# Patient Record
Sex: Female | Born: 1985 | Hispanic: Yes | Marital: Married | Smoking: Never smoker
Health system: Southern US, Community
[De-identification: ages and names within clinical notes are randomized; demographics above are authoritative.]

## PROBLEM LIST (undated history)

## (undated) DIAGNOSIS — O24419 Gestational diabetes mellitus in pregnancy, unspecified control: Secondary | ICD-10-CM

## (undated) DIAGNOSIS — J45909 Unspecified asthma, uncomplicated: Secondary | ICD-10-CM

## (undated) HISTORY — PX: SHOULDER SURGERY: SHX246

## (undated) HISTORY — DX: Gestational diabetes mellitus in pregnancy, unspecified control: O24.419

---

## 2016-12-15 ENCOUNTER — Encounter: Payer: Self-pay | Admitting: *Deleted

## 2016-12-15 ENCOUNTER — Ambulatory Visit
Admission: EM | Admit: 2016-12-15 | Discharge: 2016-12-15 | Disposition: A | Discharge status: Home / Self Care | Payer: BLUE CROSS/BLUE SHIELD | Attending: Family Medicine | Admitting: Family Medicine

## 2016-12-15 DIAGNOSIS — J4521 Mild intermittent asthma with (acute) exacerbation: Secondary | ICD-10-CM | POA: Diagnosis not present

## 2016-12-15 HISTORY — DX: Unspecified asthma, uncomplicated: J45.909

## 2016-12-15 MED ORDER — ALBUTEROL SULFATE HFA 108 (90 BASE) MCG/ACT IN AERS: INHALATION_SPRAY | RESPIRATORY_TRACT | 0 refills | Status: AC

## 2016-12-15 MED ORDER — PREDNISONE 20 MG PO TABS: ORAL_TABLET | ORAL | 0 refills | Status: DC

## 2016-12-15 MED ORDER — AZITHROMYCIN 250 MG PO TABS: ORAL_TABLET | ORAL | 0 refills | Status: DC

## 2016-12-15 NOTE — ED Provider Notes (Signed)
[facility]    CSN: [id number] Arrival date & time: 12/15/16  1826     History   Chief Complaint Chief Complaint  Patient presents with  . Shortness of Breath  . Cough  . Fever    HPI Renee Gonzalez is a 31 y.o. female.   The history is provided by the patient.  Shortness of Breath  Associated symptoms: cough, fever and wheezing   Cough  Associated symptoms: fever, shortness of breath and wheezing   Fever  Associated symptoms: congestion and cough   URI  Presenting symptoms: congestion, cough and fever   Severity:  Moderate Onset quality:  Sudden Duration:  5 days Timing:  Constant Progression:  Worsening Chronicity:  New Relieved by:  Nothing Ineffective treatments:  OTC medications Associated symptoms: wheezing   Risk factors: chronic respiratory disease (asthma) and sick contacts   Risk factors: not elderly, no chronic cardiac disease, no chronic kidney disease, no diabetes mellitus, no immunosuppression, no recent illness and no recent travel     Past Medical History:  Diagnosis Date  . Asthma     There are no active problems to display for this patient.   Past Surgical History:  Procedure Laterality Date  . SHOULDER SURGERY Right     OB History    No data available       Home Medications    Prior to Admission medications   Medication Sig Start Date End Date Taking? Authorizing Provider  albuterol (PROVENTIL HFA;VENTOLIN HFA) 108 (90 Base) MCG/ACT inhaler 2 puffs every 4-6 hours prn wheezing, cough 12/15/16   Payton Mccallum, MD  azithromycin (ZITHROMAX Z-PAK) 250 MG tablet 2 tabs po once day 1, then 1 tab po qd for next 4 days 12/15/16   Payton Mccallum, MD  predniSONE (DELTASONE) 20 MG tablet 3 tabs po qd for 2 days, then 2 tabs po qd for 3 days, then 1 tab po qd for 3 days, then half a tab po qd for 2 days 12/15/16   Payton Mccallum, MD    Family History History reviewed. No pertinent family history.  Social History Social History    Substance Use Topics  . Smoking status: Never Smoker  . Smokeless tobacco: Never Used  . Alcohol use Yes     Allergies   Patient has no known allergies.   Review of Systems Review of Systems  Constitutional: Positive for fever.  HENT: Positive for congestion.   Respiratory: Positive for cough, shortness of breath and wheezing.      Physical Exam Triage Vital Signs ED Triage Vitals  Enc Vitals Group     BP 12/15/16 1846 123/81     Pulse Rate 12/15/16 1846 91     Resp 12/15/16 1846 20     Temp 12/15/16 1846 99.1 F (37.3 C)     Temp Source 12/15/16 1846 Oral     SpO2 12/15/16 1846 100 %     Weight 12/15/16 1847 163 lb (73.9 kg)     Height 12/15/16 1847 5' (1.524 m)     Head Circumference --      Peak Flow --      Pain Score --      Pain Loc --      Pain Edu? --      Excl. in GC? --    No data found.   Updated Vital Signs BP 123/81 (BP Location: Left Arm)   Pulse 91   Temp 99.1 F (37.3 C) (Oral)  Resp 20   Ht 5' (1.524 m)   Wt 163 lb (73.9 kg)   SpO2 100%   BMI 31.83 kg/m   Visual Acuity Right Eye Distance:   Left Eye Distance:   Bilateral Distance:    Right Eye Near:   Left Eye Near:    Bilateral Near:     Physical Exam  Constitutional: She appears well-developed and well-nourished. No distress.  HENT:  Head: Normocephalic and atraumatic.  Right Ear: Tympanic membrane, external ear and ear canal normal.  Left Ear: Tympanic membrane, external ear and ear canal normal.  Nose: Mucosal edema and rhinorrhea present. No nose lacerations, sinus tenderness, nasal deformity, septal deviation or nasal septal hematoma. No epistaxis.  No foreign bodies. Right sinus exhibits no maxillary sinus tenderness and no frontal sinus tenderness. Left sinus exhibits no maxillary sinus tenderness and no frontal sinus tenderness.  Mouth/Throat: Uvula is midline, oropharynx is clear and moist and mucous membranes are normal. No oropharyngeal exudate.  Eyes: Conjunctivae  and EOM are normal. Pupils are equal, round, and reactive to light. Right eye exhibits no discharge. Left eye exhibits no discharge. No scleral icterus.  Neck: Normal range of motion. Neck supple. No thyromegaly present.  Cardiovascular: Normal rate, regular rhythm and normal heart sounds.   Pulmonary/Chest: Effort normal. No respiratory distress. She has wheezes. She has rales (left base).  Lymphadenopathy:    She has no cervical adenopathy.  Skin: She is not diaphoretic.  Nursing note and vitals reviewed.    UC Treatments / Results  Labs (all labs ordered are listed, but only abnormal results are displayed) Labs Reviewed - No data to display  EKG  EKG Interpretation None       Radiology No results found.  Procedures Procedures (including critical care time)  Medications Ordered in UC Medications - No data to display   Initial Impression / Assessment and Plan / UC Course  I have reviewed the triage vital signs and the nursing notes.  Pertinent labs & imaging results that were available during my care of the patient were reviewed by me and considered in my medical decision making (see chart for details).  Clinical Course       Final Clinical Impressions(s) / UC Diagnoses   Final diagnoses:  Exacerbation of intermittent asthma, unspecified asthma severity    New Prescriptions New Prescriptions   ALBUTEROL (PROVENTIL HFA;VENTOLIN HFA) 108 (90 BASE) MCG/ACT INHALER    2 puffs every 4-6 hours prn wheezing, cough   AZITHROMYCIN (ZITHROMAX Z-PAK) 250 MG TABLET    2 tabs po once day 1, then 1 tab po qd for next 4 days   PREDNISONE (DELTASONE) 20 MG TABLET    3 tabs po qd for 2 days, then 2 tabs po qd for 3 days, then 1 tab po qd for 3 days, then half a tab po qd for 2 days   1. diagnosis reviewed with patient 2. rx as per orders above; reviewed possible side effects, interactions, risks and benefits  3. Recommend supportive treatment with rest, fluids 4. Follow-up  prn if symptoms worsen or don't improve   Payton Mccallumrlando Chanson Teems, MD 12/15/16 1925

## 2016-12-15 NOTE — ED Triage Notes (Signed)
Non- productive cough, dyspnea, fever, head and chest congestion, since Friday.

## 2017-03-19 ENCOUNTER — Emergency Department
Admission: EM | Admit: 2017-03-19 | Discharge: 2017-03-19 | Disposition: A | Discharge status: Home / Self Care | Payer: BLUE CROSS/BLUE SHIELD | Attending: Emergency Medicine | Admitting: Emergency Medicine

## 2017-03-19 ENCOUNTER — Emergency Department: Payer: BLUE CROSS/BLUE SHIELD

## 2017-03-19 DIAGNOSIS — J45901 Unspecified asthma with (acute) exacerbation: Secondary | ICD-10-CM | POA: Diagnosis not present

## 2017-03-19 DIAGNOSIS — Z79899 Other long term (current) drug therapy: Secondary | ICD-10-CM | POA: Diagnosis not present

## 2017-03-19 DIAGNOSIS — R0602 Shortness of breath: Secondary | ICD-10-CM | POA: Diagnosis present

## 2017-03-19 LAB — COMPREHENSIVE METABOLIC PANEL
ALT: 34 U/L (ref 14–54)
ANION GAP: 9 (ref 5–15)
AST: 36 U/L (ref 15–41)
Albumin: 4.3 g/dL (ref 3.5–5.0)
Alkaline Phosphatase: 71 U/L (ref 38–126)
BUN: 11 mg/dL (ref 6–20)
CHLORIDE: 107 mmol/L (ref 101–111)
CO2: 22 mmol/L (ref 22–32)
Calcium: 9.6 mg/dL (ref 8.9–10.3)
Creatinine, Ser: 0.68 mg/dL (ref 0.44–1.00)
GFR calc non Af Amer: >60 mL/min (ref >60)
GLUCOSE: 139 mg/dL — AB (ref 65–99)
POTASSIUM: 3.8 mmol/L (ref 3.5–5.1)
Sodium: 138 mmol/L (ref 135–145)
Total Bilirubin: 1.2 mg/dL (ref 0.3–1.2)
Total Protein: 7.7 g/dL (ref 6.5–8.1)

## 2017-03-19 LAB — CBC WITH DIFFERENTIAL/PLATELET
BASOS ABS: 0.1 10*3/uL (ref 0–0.1)
Basophils Relative: 1 %
Eosinophils Absolute: 0.7 10*3/uL (ref 0–0.7)
Eosinophils Relative: 7 %
HEMATOCRIT: 41.4 % (ref 35.0–47.0)
Hemoglobin: 14.3 g/dL (ref 12.0–16.0)
LYMPHS ABS: 2.7 10*3/uL (ref 1.0–3.6)
LYMPHS PCT: 26 %
MCH: 31.6 pg (ref 26.0–34.0)
MCHC: 34.7 g/dL (ref 32.0–36.0)
MCV: 91.3 fL (ref 80.0–100.0)
MONO ABS: 0.5 10*3/uL (ref 0.2–0.9)
Monocytes Relative: 5 %
NEUTROS ABS: 6.4 10*3/uL (ref 1.4–6.5)
Neutrophils Relative %: 61 %
PLATELETS: 393 10*3/uL (ref 150–440)
RBC: 4.54 MIL/uL (ref 3.80–5.20)
RDW: 13.2 % (ref 11.5–14.5)
WBC: 10.3 10*3/uL (ref 3.6–11.0)

## 2017-03-19 MED ORDER — PREDNISONE 20 MG PO TABS
60.0000 mg | ORAL_TABLET | Freq: Once | ORAL | Status: AC
  Administered 2017-03-19: 60 mg via ORAL
  Filled 2017-03-19: qty 3

## 2017-03-19 MED ORDER — ACETAMINOPHEN 325 MG PO TABS
ORAL_TABLET | ORAL | Status: AC
  Filled 2017-03-19: qty 2

## 2017-03-19 MED ORDER — IPRATROPIUM-ALBUTEROL 0.5-2.5 (3) MG/3ML IN SOLN
RESPIRATORY_TRACT | Status: AC
  Filled 2017-03-19: qty 6

## 2017-03-19 MED ORDER — IPRATROPIUM-ALBUTEROL 0.5-2.5 (3) MG/3ML IN SOLN
9.0000 mL | Freq: Once | RESPIRATORY_TRACT | Status: AC
  Administered 2017-03-19: 9 mL via RESPIRATORY_TRACT

## 2017-03-19 MED ORDER — PREDNISONE 10 MG (21) PO TBPK: ORAL_TABLET | ORAL | 0 refills | Status: DC

## 2017-03-19 MED ORDER — METHYLPREDNISOLONE SODIUM SUCC 125 MG IJ SOLR
INTRAMUSCULAR | Status: AC
  Filled 2017-03-19: qty 2

## 2017-03-19 MED ORDER — IPRATROPIUM-ALBUTEROL 0.5-2.5 (3) MG/3ML IN SOLN
RESPIRATORY_TRACT | Status: AC
  Administered 2017-03-19: 9 mL via RESPIRATORY_TRACT
  Filled 2017-03-19: qty 3

## 2017-03-19 NOTE — ED Provider Notes (Signed)
[facility] Emergency Department Provider Note       Time seen: ----------------------------------------- 12:21 PM on 03/19/2017 -----------------------------------------     I have reviewed the triage vital signs and the nursing notes.   HISTORY   Chief Complaint Shortness of Breath    HPI Renee Gonzalez is a 31 y.o. female who presents to the ED for asthma exacerbation. Patient states her asthma has been worsened since yesterday. She's had tachypnea, states her inhaler at home was not helping. She arrives in distress with audible wheezing. She denies fevers, chills or other complaints.   Past Medical History:  Diagnosis Date  . Asthma     There are no active problems to display for this patient.   Past Surgical History:  Procedure Laterality Date  . SHOULDER SURGERY Right     Allergies Patient has no known allergies.  Social History Social History  Substance Use Topics  . Smoking status: Never Smoker  . Smokeless tobacco: Never Used  . Alcohol use Yes    Review of Systems Constitutional: Negative for fever. Cardiovascular: Negative for chest pain. Respiratory: Positive for shortness of breath Gastrointestinal: Negative for abdominal pain, vomiting and diarrhea. Genitourinary: Negative for dysuria. Musculoskeletal: Negative for back pain. Skin: Negative for rash. Neurological: Negative for headaches, focal weakness or numbness.  10-point ROS otherwise negative.  ____________________________________________   PHYSICAL EXAM:  VITAL SIGNS: ED Triage Vitals  Enc Vitals Group     BP 03/19/17 1152 (!) 152/120     Pulse Rate 03/19/17 1152 (!) 103     Resp 03/19/17 1152 14     Temp 03/19/17 1152 98.1 F (36.7 C)     Temp Source 03/19/17 1152 Oral     SpO2 03/19/17 1152 96 %     Weight 03/19/17 1153 160 lb (72.6 kg)     Height 03/19/17 1153  (1.575 m)     Head Circumference --      Peak Flow --      Pain Score 03/19/17  1206 0     Pain Loc --      Pain Edu? --      Excl. in GC? --     Constitutional: Alert and oriented. Mild to moderate distress Eyes: Conjunctivae are normal. PERRL. Normal extraocular movements. ENT   Head: Normocephalic and atraumatic.   Nose: No congestion/rhinnorhea.   Mouth/Throat: Mucous membranes are moist.   Neck: No stridor. Cardiovascular: Normal rate, regular rhythm. No murmurs, rubs, or gallops. Respiratory: Severe wheezing bilaterally with tachypnea  Gastrointestinal: Soft and nontender. Normal bowel sounds Musculoskeletal: Nontender with normal range of motion in extremities. No lower extremity tenderness nor edema. Neurologic:  Normal speech and language. No gross focal neurologic deficits are appreciated.  Skin:  Skin is warm, dry and intact. No rash noted. Psychiatric: Mood and affect are normal. Speech and behavior are normal.  ____________________________________________  ED COURSE:  Pertinent labs & imaging results that were available during my care of the patient were reviewed by me and considered in my medical decision making (see chart for details). Patient presents for asthma exacerbation, we will assess with labs and imaging as indicated.   Procedures ____________________________________________   LABS (pertinent positives/negatives)  Labs Reviewed  COMPREHENSIVE METABOLIC PANEL - Abnormal; Notable for the following:       Result Value   Glucose, Bld 139 (*)    All other components within normal limits  CBC WITH DIFFERENTIAL/PLATELET    RADIOLOGY Images were viewed by me  Chest x-ray  IMPRESSION: No active cardiopulmonary disease. ____________________________________________  FINAL ASSESSMENT AND PLAN  Asthma exacerbation  Plan: Patient's labs and imaging were dictated above. Patient had presented for asthma, she received 3 DuoNebs as well as oral steroids.Patient had marked improvement in her asthma exacerbation. He only  minimal wheezing with expiration. She was discharged with steroid taper and encouraged to have outpatient follow-up with her doctor.   Emily Filbert, MD   Note: This note was generated in part or whole with voice recognition software. Voice recognition is usually quite accurate but there are transcription errors that can and very often do occur. I apologize for any typographical errors that were not detected and corrected.     Emily Filbert, MD 03/19/17 1323

## 2017-03-19 NOTE — ED Triage Notes (Signed)
Pt c/o asthma flare since yesterday with tachypnea, states her inhaler and nebulizer is not helping.. Pt is distress on arrival with audible wheezing noted.Marland Kitchen

## 2017-03-19 NOTE — ED Notes (Signed)
Patient transported to X-ray 

## 2018-10-17 ENCOUNTER — Encounter: Payer: Self-pay | Admitting: Emergency Medicine

## 2018-10-17 ENCOUNTER — Other Ambulatory Visit: Payer: Self-pay

## 2018-10-17 ENCOUNTER — Inpatient Hospital Stay
Admission: EM | Admit: 2018-10-17 | Discharge: 2018-10-18 | DRG: 202 | Disposition: A | Discharge status: Home / Self Care | Payer: Self-pay | Attending: Internal Medicine | Admitting: Internal Medicine

## 2018-10-17 ENCOUNTER — Emergency Department: Payer: Self-pay

## 2018-10-17 DIAGNOSIS — R0603 Acute respiratory distress: Secondary | ICD-10-CM

## 2018-10-17 DIAGNOSIS — E669 Obesity, unspecified: Secondary | ICD-10-CM | POA: Diagnosis present

## 2018-10-17 DIAGNOSIS — Z6830 Body mass index (BMI) 30.0-30.9, adult: Secondary | ICD-10-CM

## 2018-10-17 DIAGNOSIS — R402253 Coma scale, best verbal response, oriented, at hospital admission: Secondary | ICD-10-CM | POA: Diagnosis present

## 2018-10-17 DIAGNOSIS — J9621 Acute and chronic respiratory failure with hypoxia: Secondary | ICD-10-CM | POA: Diagnosis present

## 2018-10-17 DIAGNOSIS — J9620 Acute and chronic respiratory failure, unspecified whether with hypoxia or hypercapnia: Secondary | ICD-10-CM

## 2018-10-17 DIAGNOSIS — J189 Pneumonia, unspecified organism: Secondary | ICD-10-CM | POA: Diagnosis present

## 2018-10-17 DIAGNOSIS — Z793 Long term (current) use of hormonal contraceptives: Secondary | ICD-10-CM

## 2018-10-17 DIAGNOSIS — R0902 Hypoxemia: Secondary | ICD-10-CM

## 2018-10-17 DIAGNOSIS — R402363 Coma scale, best motor response, obeys commands, at hospital admission: Secondary | ICD-10-CM | POA: Diagnosis present

## 2018-10-17 DIAGNOSIS — R402143 Coma scale, eyes open, spontaneous, at hospital admission: Secondary | ICD-10-CM | POA: Diagnosis present

## 2018-10-17 DIAGNOSIS — J4551 Severe persistent asthma with (acute) exacerbation: Principal | ICD-10-CM | POA: Diagnosis present

## 2018-10-17 DIAGNOSIS — Z79899 Other long term (current) drug therapy: Secondary | ICD-10-CM

## 2018-10-17 DIAGNOSIS — J9622 Acute and chronic respiratory failure with hypercapnia: Secondary | ICD-10-CM | POA: Diagnosis present

## 2018-10-17 LAB — CBC WITH DIFFERENTIAL/PLATELET
ABS IMMATURE GRANULOCYTES: 0.14 10*3/uL — AB (ref 0.00–0.07)
BASOS ABS: 0.1 10*3/uL (ref 0.0–0.1)
BASOS PCT: 1 %
EOS ABS: 0.1 10*3/uL (ref 0.0–0.5)
Eosinophils Relative: 1 %
HCT: 43.4 % (ref 36.0–46.0)
Hemoglobin: 14.5 g/dL (ref 12.0–15.0)
IMMATURE GRANULOCYTES: 1 %
Lymphocytes Relative: 17 %
Lymphs Abs: 2.8 10*3/uL (ref 0.7–4.0)
MCH: 31.7 pg (ref 26.0–34.0)
MCHC: 33.4 g/dL (ref 30.0–36.0)
MCV: 94.8 fL (ref 80.0–100.0)
Monocytes Absolute: 0.9 10*3/uL (ref 0.1–1.0)
Monocytes Relative: 6 %
NEUTROS ABS: 12.4 10*3/uL — AB (ref 1.7–7.7)
NEUTROS PCT: 74 %
NRBC: 0 % (ref 0.0–0.2)
Platelets: 379 10*3/uL (ref 150–400)
RBC: 4.58 MIL/uL (ref 3.87–5.11)
RDW: 12.8 % (ref 11.5–15.5)
WBC: 16.4 10*3/uL — ABNORMAL HIGH (ref 4.0–10.5)

## 2018-10-17 LAB — GLUCOSE, CAPILLARY: Glucose-Capillary: 165 mg/dL — ABNORMAL HIGH (ref 70–99)

## 2018-10-17 LAB — BLOOD GAS, ARTERIAL
Acid-base deficit: 6.7 mmol/L — ABNORMAL HIGH (ref 0.0–2.0)
BICARBONATE: 17.3 mmol/L — AB (ref 20.0–28.0)
Delivery systems: POSITIVE
Expiratory PAP: 5
FIO2: 0.4
Inspiratory PAP: 10
O2 SAT: 99.6 %
PATIENT TEMPERATURE: 37
pCO2 arterial: 30 mmHg — ABNORMAL LOW (ref 32.0–48.0)
pH, Arterial: 7.37 (ref 7.350–7.450)
pO2, Arterial: 190 mmHg — ABNORMAL HIGH (ref 83.0–108.0)

## 2018-10-17 LAB — TSH: TSH: 3.37 u[IU]/mL (ref 0.350–4.500)

## 2018-10-17 LAB — MRSA PCR SCREENING: MRSA BY PCR: NEGATIVE

## 2018-10-17 LAB — BASIC METABOLIC PANEL
ANION GAP: 14 (ref 5–15)
BUN: 10 mg/dL (ref 6–20)
CO2: 21 mmol/L — ABNORMAL LOW (ref 22–32)
CREATININE: 0.76 mg/dL (ref 0.44–1.00)
Calcium: 10.3 mg/dL (ref 8.9–10.3)
Chloride: 110 mmol/L (ref 98–111)
GFR calc non Af Amer: >60 mL/min (ref >60)
Glucose, Bld: 151 mg/dL — ABNORMAL HIGH (ref 70–99)
POTASSIUM: 4 mmol/L (ref 3.5–5.1)
SODIUM: 145 mmol/L (ref 135–145)

## 2018-10-17 LAB — URINE DRUG SCREEN, QUALITATIVE (ARMC ONLY)
AMPHETAMINES, UR SCREEN: NOT DETECTED
BARBITURATES, UR SCREEN: NOT DETECTED
BENZODIAZEPINE, UR SCRN: NOT DETECTED
Cannabinoid 50 Ng, Ur ~~LOC~~: NOT DETECTED
Cocaine Metabolite,Ur ~~LOC~~: NOT DETECTED
MDMA (Ecstasy)Ur Screen: NOT DETECTED
METHADONE SCREEN, URINE: NOT DETECTED
OPIATE, UR SCREEN: NOT DETECTED
Phencyclidine (PCP) Ur S: NOT DETECTED
TRICYCLIC, UR SCREEN: NOT DETECTED

## 2018-10-17 LAB — TROPONIN I: Troponin I: <0.03 ng/mL (ref <0.03)

## 2018-10-17 LAB — PREGNANCY, URINE: PREG TEST UR: NEGATIVE

## 2018-10-17 LAB — INFLUENZA PANEL BY PCR (TYPE A & B)
Influenza A By PCR: NEGATIVE
Influenza B By PCR: NEGATIVE

## 2018-10-17 MED ORDER — ACETAMINOPHEN 650 MG RE SUPP: 650.0000 mg | Freq: Four times a day (QID) | RECTAL | Status: DC | PRN

## 2018-10-17 MED ORDER — SODIUM CHLORIDE 0.9 % IV SOLN
500.0000 mg | INTRAVENOUS | Status: DC
  Administered 2018-10-17: 500 mg via INTRAVENOUS
  Filled 2018-10-17 (×2): qty 500

## 2018-10-17 MED ORDER — CEFTRIAXONE SODIUM 1 G IJ SOLR: 1.0000 g | INTRAMUSCULAR | Status: DC

## 2018-10-17 MED ORDER — SODIUM CHLORIDE 0.9 % IV SOLN
1.0000 g | INTRAVENOUS | Status: DC
  Administered 2018-10-17 – 2018-10-18 (×2): 1 g via INTRAVENOUS
  Filled 2018-10-17 (×2): qty 1
  Filled 2018-10-17 (×2): qty 10

## 2018-10-17 MED ORDER — LORATADINE 10 MG PO TABS
10.0000 mg | ORAL_TABLET | Freq: Every day | ORAL | Status: DC
  Administered 2018-10-17 – 2018-10-18 (×2): 10 mg via ORAL
  Filled 2018-10-17 (×2): qty 1

## 2018-10-17 MED ORDER — IPRATROPIUM-ALBUTEROL 0.5-2.5 (3) MG/3ML IN SOLN
3.0000 mL | Freq: Once | RESPIRATORY_TRACT | Status: AC
  Administered 2018-10-17: 3 mL via RESPIRATORY_TRACT

## 2018-10-17 MED ORDER — ACETAMINOPHEN 325 MG PO TABS
650.0000 mg | ORAL_TABLET | Freq: Four times a day (QID) | ORAL | Status: DC | PRN
  Administered 2018-10-18: 650 mg via ORAL
  Filled 2018-10-17: qty 2

## 2018-10-17 MED ORDER — METHYLPREDNISOLONE SODIUM SUCC 125 MG IJ SOLR
INTRAMUSCULAR | Status: AC
  Administered 2018-10-17: 125 mg via INTRAVENOUS
  Filled 2018-10-17: qty 2

## 2018-10-17 MED ORDER — BUDESONIDE 0.5 MG/2ML IN SUSP
0.5000 mg | Freq: Two times a day (BID) | RESPIRATORY_TRACT | Status: DC
  Administered 2018-10-17 – 2018-10-18 (×3): 0.5 mg via RESPIRATORY_TRACT
  Filled 2018-10-17 (×3): qty 2

## 2018-10-17 MED ORDER — ONDANSETRON HCL 4 MG/2ML IJ SOLN: 4.0000 mg | Freq: Four times a day (QID) | INTRAMUSCULAR | Status: DC | PRN

## 2018-10-17 MED ORDER — METHYLPREDNISOLONE SODIUM SUCC 125 MG IJ SOLR
125.0000 mg | Freq: Once | INTRAMUSCULAR | Status: AC
  Administered 2018-10-17: 125 mg via INTRAVENOUS

## 2018-10-17 MED ORDER — METHYLPREDNISOLONE SODIUM SUCC 125 MG IJ SOLR: 60.0000 mg | INTRAMUSCULAR | Status: DC

## 2018-10-17 MED ORDER — SODIUM CHLORIDE 0.9 % IV SOLN
INTRAVENOUS | Status: DC | PRN
  Administered 2018-10-17: 250 mL via INTRAVENOUS

## 2018-10-17 MED ORDER — METHYLPREDNISOLONE SODIUM SUCC 40 MG IJ SOLR
40.0000 mg | Freq: Two times a day (BID) | INTRAMUSCULAR | Status: DC
  Administered 2018-10-17 – 2018-10-18 (×2): 40 mg via INTRAVENOUS
  Filled 2018-10-17 (×2): qty 1

## 2018-10-17 MED ORDER — ONDANSETRON HCL 4 MG PO TABS: 4.0000 mg | ORAL_TABLET | Freq: Four times a day (QID) | ORAL | Status: DC | PRN

## 2018-10-17 MED ORDER — ENOXAPARIN SODIUM 40 MG/0.4ML ~~LOC~~ SOLN
40.0000 mg | SUBCUTANEOUS | Status: DC
  Administered 2018-10-17: 40 mg via SUBCUTANEOUS
  Filled 2018-10-17: qty 0.4

## 2018-10-17 MED ORDER — MAGNESIUM SULFATE 2 GM/50ML IV SOLN
2.0000 g | Freq: Once | INTRAVENOUS | Status: AC
  Administered 2018-10-17: 2 g via INTRAVENOUS
  Filled 2018-10-17: qty 50

## 2018-10-17 MED ORDER — IPRATROPIUM-ALBUTEROL 0.5-2.5 (3) MG/3ML IN SOLN
RESPIRATORY_TRACT | Status: AC
  Administered 2018-10-17: 3 mL via RESPIRATORY_TRACT
  Filled 2018-10-17: qty 9

## 2018-10-17 MED ORDER — MONTELUKAST SODIUM 10 MG PO TABS
10.0000 mg | ORAL_TABLET | Freq: Every day | ORAL | Status: DC
  Administered 2018-10-17: 10 mg via ORAL
  Filled 2018-10-17 (×2): qty 1

## 2018-10-17 MED ORDER — DOCUSATE SODIUM 100 MG PO CAPS
100.0000 mg | ORAL_CAPSULE | Freq: Two times a day (BID) | ORAL | Status: DC
  Filled 2018-10-17 (×2): qty 1

## 2018-10-17 MED ORDER — ALBUTEROL SULFATE (2.5 MG/3ML) 0.083% IN NEBU
2.5000 mg | INHALATION_SOLUTION | RESPIRATORY_TRACT | Status: DC
  Administered 2018-10-17 – 2018-10-18 (×7): 2.5 mg via RESPIRATORY_TRACT
  Filled 2018-10-17 (×8): qty 3

## 2018-10-17 NOTE — H&P (Signed)
Renee Gonzalez is an 32 y.o. female.   Chief Complaint: Shortness of breath HPI: The patient with past medical history of asthma presents to the emergency department by private vehicle after awakening at 5 AM unable to breathe.  The patient had returned home approximately midnight with shortness of breath.  She tried using her albuterol inhaler which helped briefly but the patient continued to wheeze and have increased work of breathing.  Upon arrival she was tripod positioned and moving very little air.  She was placed on BiPAP after receiving Solu-Medrol.  Multiple breathing treatments were administered which improved air movement but not work of breathing which prompted the emergency department staff to call the hospitalist service for admission.  Past Medical History:  Diagnosis Date  . Asthma     Past Surgical History:  Procedure Laterality Date  . SHOULDER SURGERY Right     Family History  Problem Relation Age of Onset  . Diabetes Mellitus II Mother    Social History:  reports that she has never smoked. She has never used smokeless tobacco. She reports that she drinks alcohol. Her drug history is not on file.  Allergies: No Known Allergies  Prior to Admission medications   Medication Sig Start Date End Date Taking? Authorizing Provider  albuterol (PROVENTIL HFA;VENTOLIN HFA) 108 (90 Base) MCG/ACT inhaler 2 puffs every 4-6 hours prn wheezing, cough 12/15/16  Yes Conty, Orlando, MD  levonorgestrel-ethinyl estradiol (NORDETTE) 0.15-30 MG-MCG tablet Take 1 tablet by mouth daily. 12/10/17  Yes [provider]  loratadine (CLARITIN) 10 MG tablet Take 10 mg by mouth daily.   Yes [provider]  pseudoephedrine (SUDAFED) 30 MG tablet Take 30 mg by mouth every 4 (four) hours as needed for congestion.   Yes [provider]  predniSONE (STERAPRED UNI-PAK 21 TAB) 10 MG (21) TBPK tablet Dispense steroid taper pack as directed Patient not taking: Reported on 10/17/2018  03/19/17   Earleen Newport, MD     Results for orders placed or performed during the hospital encounter of 10/17/18 (from the past 48 hour(s))  CBC with Differential     Status: Abnormal   Collection Time: 10/17/18  5:59 AM  Result Value Ref Range   WBC 16.4 (H) 4.0 - 10.5 K/uL   RBC 4.58 3.87 - 5.11 MIL/uL   Hemoglobin 14.5 12.0 - 15.0 g/dL   HCT 43.4 36.0 - 46.0 %   MCV 94.8 80.0 - 100.0 fL   MCH 31.7 26.0 - 34.0 pg   MCHC 33.4 30.0 - 36.0 g/dL   RDW 12.8 11.5 - 15.5 %   Platelets 379 150 - 400 K/uL   nRBC 0.0 0.0 - 0.2 %   Neutrophils Relative % 74 %   Neutro Abs 12.4 (H) 1.7 - 7.7 K/uL   Lymphocytes Relative 17 %   Lymphs Abs 2.8 0.7 - 4.0 K/uL   Monocytes Relative 6 %   Monocytes Absolute 0.9 0.1 - 1.0 K/uL   Eosinophils Relative 1 %   Eosinophils Absolute 0.1 0.0 - 0.5 K/uL   Basophils Relative 1 %   Basophils Absolute 0.1 0.0 - 0.1 K/uL   Immature Granulocytes 1 %   Abs Immature Granulocytes 0.14 (H) 0.00 - 0.07 K/uL    Comment: Performed at [facility], [address] [place], [place] [address]  Basic metabolic panel     Status: Abnormal   Collection Time: 10/17/18  5:59 AM  Result Value Ref Range   Sodium 145 135 - 145  mmol/L   Potassium 4.0 3.5 - 5.1 mmol/L   Chloride 110 98 - 111 mmol/L   CO2 21 (L) 22 - 32 mmol/L   Glucose, Bld 151 (H) 70 - 99 mg/dL   BUN 10 6 - 20 mg/dL   Creatinine, Ser 0.76 0.44 - 1.00 mg/dL   Calcium 10.3 8.9 - 10.3 mg/dL   GFR calc non Af Amer >60 >60 mL/min   GFR calc Af Amer >60 >60 mL/min    Comment: (NOTE) The eGFR has been calculated using the CKD EPI equation. This calculation has not been validated in all clinical situations. eGFR's persistently <60 mL/min signify possible Chronic Kidney Disease.    Anion gap 14 5 - 15    Comment: Performed at [facility], [address] [place], [place] [address]  Troponin I - Once     Status: None   Collection Time: 10/17/18  5:59 AM  Result Value Ref Range    Troponin I <0.03 <0.03 ng/mL    Comment: Performed at [facility], [address] [place], [place] [address]  Blood gas, arterial     Status: Abnormal   Collection Time: 10/17/18  6:30 AM  Result Value Ref Range   FIO2 0.40    Delivery systems BILEVEL POSITIVE AIRWAY PRESSURE    Inspiratory PAP 10    Expiratory PAP 5    pH, Arterial 7.37 7.350 - 7.450   pCO2 arterial 30 (L) 32.0 - 48.0 mmHg   pO2, Arterial 190 (H) 83.0 - 108.0 mmHg   Bicarbonate 17.3 (L) 20.0 - 28.0 mmol/L   Acid-base deficit 6.7 (H) 0.0 - 2.0 mmol/L   O2 Saturation 99.6 %   Patient temperature 37.0    Collection site RIGHT RADIAL    Sample type ARTERIAL DRAW    Allens test (pass/fail) PASS PASS    Comment: Performed at [facility], [address] [place], [place] [address]   Dg Chest Port 1 View  Result Date: 10/17/2018 CLINICAL DATA:  Acute onset of respiratory distress. EXAM: PORTABLE CHEST 1 VIEW COMPARISON:  Chest radiograph performed 03/19/2017 FINDINGS: The lungs are well-aerated. Mild peribronchial thickening is noted. Minimal left basilar atelectasis is noted. There is no evidence of pleural effusion or pneumothorax. The cardiomediastinal silhouette is within normal limits. No acute osseous abnormalities are seen. IMPRESSION: Mild peribronchial thickening noted; minimal left basilar atelectasis seen. Electronically Signed   By: Garald Balding M.D.   On: 10/17/2018 05:57    Review of Systems  Constitutional: Negative for chills and fever.  HENT: Negative for sore throat and tinnitus.   Eyes: Negative for blurred vision and redness.  Respiratory: Positive for cough, shortness of breath and wheezing. Negative for sputum production.   Cardiovascular: Negative for chest pain, palpitations, orthopnea and PND.  Gastrointestinal: Negative for abdominal pain, diarrhea, nausea and vomiting.  Genitourinary: Negative for dysuria, frequency and urgency.  Musculoskeletal: Negative for joint pain  and myalgias.  Skin: Negative for rash.       No lesions  Neurological: Negative for speech change, focal weakness and weakness.  Endo/Heme/Allergies: Does not bruise/bleed easily.       No temperature intolerance  Psychiatric/Behavioral: Negative for depression and suicidal ideas.    Blood pressure 134/82, pulse (!) 119, temperature (!) 97.5 F (36.4 C), temperature source Oral, resp. rate 17, height '5\' 3"'$  (1.6 m), weight 77.1 kg, SpO2 100 %. Physical Exam  Vitals reviewed. Constitutional: She is oriented to person, place, and time. She appears  well-developed and well-nourished. She appears distressed.  HENT:  Head: Normocephalic and atraumatic.  Eyes: Pupils are equal, round, and reactive to light. EOM are normal. Right conjunctiva is injected. No scleral icterus.  Neck: Normal range of motion. No tracheal deviation present. No thyromegaly present.  Cardiovascular: Normal rate, regular rhythm and normal heart sounds. Exam reveals no gallop and no friction rub.  No murmur heard. Respiratory: Effort normal. She has wheezes.  GI: Soft. Bowel sounds are normal. She exhibits no distension. There is no tenderness.  Genitourinary:  Genitourinary Comments: Deferred  Musculoskeletal: Normal range of motion. She exhibits no edema.  Lymphadenopathy:    She has no cervical adenopathy.  Neurological: She is alert and oriented to person, place, and time. No cranial nerve deficit. She exhibits normal muscle tone.  Skin: Skin is warm and dry. No rash noted. No erythema.  Psychiatric: She has a normal mood and affect. Her behavior is normal. Judgment and thought content normal.     Assessment/Plan This is a 32 year old female admitted for respiratory failure. 1.  Respiratory failure: Acute; with hypoxia.  Continue BiPAP until work of breathing improves.  Wean supplemental O2 as tolerated.   2.  Asthma: With severe exacerbation.  Continue Solu-Medrol 60 mg daily.  Scheduled breathing treatments  every 4 hours.  Add inhaled corticosteroid.  Manage triggers.  (The patient is apparently allergic to cats and was exposed this evening) 3.  Obesity: BMI is 30.1; encouraged healthy diet and exercise 4.  DVT prophylaxis: Lovenox 5.  GI prophylaxis: None Exline the patient is a full code.  I was personally spent 45 minutes critical care time.  Harrie Foreman, MD 10/17/2018, 7:58 AM

## 2018-10-17 NOTE — ED Notes (Signed)
Dr. Sheryle Hailiamond to bedside at this time to discuss patient concerns regarding BiPap and admission to ICU. Dr. Sheryle Hailiamond discussing importance of BiPap and necessity.

## 2018-10-17 NOTE — Progress Notes (Signed)
Pt stated that she feels much better now with her breathing, good aeration throughout all lung fields. BIPAP removed sats on roomair 95%, respiratory rate 18/min. RN made aware. Will continue to monitor.

## 2018-10-17 NOTE — Progress Notes (Signed)
PT on room air since 1100, O2 sats in 90s, wheezing resolved, breath sounds are clear, ST in low 100s, ABX have been administered for possible PNA, [facility].  No SOB w/ exertion or speech.  Oral intake excellent.  Pt reports she feels "fine." Will transfer to any med-surg when bed available

## 2018-10-17 NOTE — Consult Note (Signed)
Name: Malka Soceneth Simao MRN: [id number] DOB: 10/11/1986     CONSULTATION DATE: 10/17/2018  REFERRING MD : Sheryle Hailiamond  CHIEF COMPLAINT: SOB   HISTORY OF PRESENT ILLNESS:   32 yo female with acute and severe SOB last night Has h/o ASTHMA  Patient placed on biPAP Treated with BD therapy and steroids   I have Independently reviewed images of  CXR  on 10/17/2018 Interpretation:  Left Lower lobe opacity C/w pneumonia  Patient on biPAP Still wheezing  PAST MEDICAL HISTORY :   has a past medical history of Asthma.  has a past surgical history that includes Shoulder surgery (Right). Prior to Admission medications   Medication Sig Start Date End Date Taking? Authorizing Provider  albuterol (PROVENTIL HFA;VENTOLIN HFA) 108 (90 Base) MCG/ACT inhaler 2 puffs every 4-6 hours prn wheezing, cough 12/15/16  Yes Conty, Orlando, MD  levonorgestrel-ethinyl estradiol (NORDETTE) 0.15-30 MG-MCG tablet Take 1 tablet by mouth daily. 12/10/17  Yes [provider]  loratadine (CLARITIN) 10 MG tablet Take 10 mg by mouth daily.   Yes [provider]  pseudoephedrine (SUDAFED) 30 MG tablet Take 30 mg by mouth every 4 (four) hours as needed for congestion.   Yes [provider]  predniSONE (STERAPRED UNI-PAK 21 TAB) 10 MG (21) TBPK tablet Dispense steroid taper pack as directed Patient not taking: Reported on 10/17/2018 03/19/17   Emily FilbertWilliams, Jonathan E, MD   No Known Allergies  FAMILY HISTORY:  family history includes Diabetes Mellitus II in her mother. SOCIAL HISTORY:  reports that she has never smoked. She has never used smokeless tobacco. She reports that she drinks alcohol.  SURG hx-no surgeries per patient Review of Systems:  MVH:QIONGen:resp distress HEENT: Denies blurred vision, double vision, ear pain, eye pain, hearing loss, nose bleeds, sore throat Cardiac:  No dizziness, chest pain or heaviness, chest tightness,edema, No JVD Resp:+SOB +Wheezing Gi: Denies swallowing  difficulty, stomach pain, nausea or vomiting, diarrhea, constipation, bowel incontinence Gu:  Denies bladder incontinence, burning urine Ext:   Denies Joint pain, stiffness or swelling Skin: Denies  skin rash, easy bruising or bleeding or hives Endoc:  Denies polyuria, polydipsia , polyphagia or weight change Psych:   Denies depression, insomnia or hallucinations  Other:  All other systems negative   VITAL SIGNS: Temp:  [97.5 F (36.4 C)] 97.5 F (36.4 C) (11/17 0549) Pulse Rate:  [118-149] 119 (11/17 0745) Resp:  [17-36] 17 (11/17 0745) BP: (128-172)/(72-103) 134/82 (11/17 0745) SpO2:  [77 %-100 %] 100 % (11/17 0745) Weight:  [77.1 kg] 77.1 kg (11/17 [id number])  Physical Examination:  GENERAL:critically ill appearing, +resp distress HEAD: Normocephalic, atraumatic.  EYES: Pupils equal, round, reactive to light.  No scleral icterus.  MOUTH: Moist mucosal membrane. NECK: Supple. No JVD.  PULMONARY: +rhonchi, +wheezing CARDIOVASCULAR: S1 and S2. Regular rate and rhythm. No murmurs, rubs, or gallops.  GASTROINTESTINAL: Soft, nontender, -distended. No masses. Positive bowel sounds. No hepatosplenomegaly.  MUSCULOSKELETAL: No swelling, clubbing, or edema.  NEUROLOGIC: obtunded SKIN:intact,warm,dry  I personally reviewed lab work that was obtained in last 24 hrs. CXR Independently reviewed-left lung pneumonia/opacity   ASSESSMENT / PLAN:  32 yo female with acute resp failure from acute ASTHMA exacerbation from acute Left lung pneumonia  Severe Hypoxic and Hypercapnic Respiratory Failure -aggressive BD therapy IV steroids IV abx Wean off biPAP Oxygen as needed   INFECTIOUS DISEASE-Pneumonia left lung -continue antibiotics as prescribed -follow up cultures  ELECTROLYTES -follow labs as needed -replace as needed -pharmacy consultation and following    DVT/GI  PRX ordered TRANSFUSIONS AS NEEDED MONITOR FSBS ASSESS the need for LABS as needed  Check UDS and pregnancy  test     Lucie Leather, M.D.  [facility]  Medical Director [facility] Medical Director [facility] Cardio-Pulmonary Department

## 2018-10-17 NOTE — Plan of Care (Signed)
Pt and family oriented to ICU, flu and MRSA swabs sent to lab, pt currently with O2 sats 98 on BiPap, A&O x 4, no pain at this time, remains with bilateral wheezing in all lung fields.  MD Belia HemanKasa has been to room to talk with patient

## 2018-10-17 NOTE — ED Triage Notes (Addendum)
Pt arrived from home in respiratory distress; pt unable to speak any words; husband says pt has a history of asthma and is allergic to cats; they were at a friends house earlier and her breathing problems started a few hours ago; pt brought straight back to treatment room 15; Dr Dolores FrameSung immediately in to see pt; this RN, Elon JesterMichele, RN and Lurena Joinerebecca, RN at bedside; pt placed on NRB, MD to have resp therapy paged for BiPap

## 2018-10-17 NOTE — Progress Notes (Signed)
[facility] [facility] - [place] at [facility]   PATIENT NAME: Renee Gonzalez    MR#:  [id number]  DATE OF BIRTH:  1986/09/22  SUBJECTIVE:  CHIEF COMPLAINT:   Chief Complaint  Patient presents with  . Respiratory Distress  Patient seen and evaluated today Patient has been weaned off BiPAP Comfortable and not in respiratory distress Has some wheezing  REVIEW OF SYSTEMS:    ROS  CONSTITUTIONAL: No documented fever. No fatigue, weakness. No weight gain, no weight loss.  EYES: No blurry or double vision.  ENT: No tinnitus. No postnasal drip. No redness of the oropharynx.  RESPIRATORY: Occasional cough, has wheeze, no hemoptysis. Decreased dyspnea.  CARDIOVASCULAR: No chest pain. No orthopnea. No palpitations. No syncope.  GASTROINTESTINAL: No nausea, no vomiting or diarrhea. No abdominal pain. No melena or hematochezia.  GENITOURINARY: No dysuria or hematuria.  ENDOCRINE: No polyuria or nocturia. No heat or cold intolerance.  HEMATOLOGY: No anemia. No bruising. No bleeding.  INTEGUMENTARY: No rashes. No lesions.  MUSCULOSKELETAL: No arthritis. No swelling. No gout.  NEUROLOGIC: No numbness, tingling, or ataxia. No seizure-type activity.  PSYCHIATRIC: No anxiety. No insomnia. No ADD.   DRUG ALLERGIES:  No Known Allergies  VITALS:  Blood pressure 125/75, pulse (!) 111, temperature 98.1 F (36.7 C), temperature source Axillary, resp. rate 16, height 5\' 3"  (1.6 m), weight 76.7 kg, SpO2 97 %.  PHYSICAL EXAMINATION:   Physical Exam  GENERAL:  32 y.o.-year-old patient lying in the bed with no acute distress.  EYES: Pupils equal, round, reactive to light and accommodation. No scleral icterus. Extraocular muscles intact.  HEENT: Head atraumatic, normocephalic. Oropharynx and nasopharynx clear.  NECK:  Supple, no jugular venous distention. No thyroid enlargement, no tenderness.  LUNGS: Decreased breath sounds bilaterally, bilateral wheezing, rales, rhonchi. No use of accessory  muscles of respiration.  CARDIOVASCULAR: S1, S2 normal. No murmurs, rubs, or gallops.  ABDOMEN: Soft, nontender, nondistended. Bowel sounds present. No organomegaly or mass.  EXTREMITIES: No cyanosis, clubbing or edema b/l.    NEUROLOGIC: Cranial nerves II through XII are intact. No focal Motor or sensory deficits b/l.   PSYCHIATRIC: The patient is alert and oriented x 3.  SKIN: No obvious rash, lesion, or ulcer.   LABORATORY PANEL:   CBC Recent Labs  Lab 10/17/18 0559  WBC 16.4*  HGB 14.5  HCT 43.4  PLT 379   ------------------------------------------------------------------------------------------------------------------ Chemistries  Recent Labs  Lab 10/17/18 0559  NA 145  K 4.0  CL 110  CO2 21*  GLUCOSE 151*  BUN 10  CREATININE 0.76  CALCIUM 10.3   ------------------------------------------------------------------------------------------------------------------  Cardiac Enzymes Recent Labs  Lab 10/17/18 0559  TROPONINI <0.03   ------------------------------------------------------------------------------------------------------------------  RADIOLOGY:  Dg Chest Port 1 View  Result Date: 10/17/2018 CLINICAL DATA:  Acute onset of respiratory distress. EXAM: PORTABLE CHEST 1 VIEW COMPARISON:  Chest radiograph performed 03/19/2017 FINDINGS: The lungs are well-aerated. Mild peribronchial thickening is noted. Minimal left basilar atelectasis is noted. There is no evidence of pleural effusion or pneumothorax. The cardiomediastinal silhouette is within normal limits. No acute osseous abnormalities are seen. IMPRESSION: Mild peribronchial thickening noted; minimal left basilar atelectasis seen. Electronically Signed   By: Roanna Raider M.D.   On: 10/17/2018 05:57     ASSESSMENT AND PLAN:  32 year old female patient with history of bronchial asthma currently under stepdown unit for respiratory distress  -Acute respiratory failure with hypoxia Weaned of  BiPAP Nebulization treatments aggressively  -Acute bronchial asthma exacerbation Continue IV Solu-Medrol 60 m daily Nebulization  treatments Inhaled steroids  -DVT prophylaxis subcu Lovenox daily  -Obesity Diet and exercise recommended   All the records are reviewed and case discussed with Care Management/Social Worker. Management plans discussed with the patient, family and they are in agreement.  CODE STATUS: Full code  DVT Prophylaxis: SCDs  TOTAL TIME TAKING CARE OF THIS PATIENT: 45 minutes.   POSSIBLE D/C IN 1 DAYS, DEPENDING ON CLINICAL CONDITION.  Ihor AustinPavan Elward Nocera M.D on 10/17/2018 at 11:17 AM  Between 7am to 6pm - Pager - [phone]  After 6pm go to [email or web address] - password [email or web address]  [facility] [place] [facility]  Office  [phone]  CC: Primary care physician; Patient, No Pcp Per  Note: This dictation was prepared with Dragon dictation along with smaller phrase technology. Any transcriptional errors that result from this process are unintentional.

## 2018-10-17 NOTE — ED Provider Notes (Signed)
[facility] Emergency Department Provider Note   ____________________________________________   First MD Initiated Contact with Patient 10/17/18 [phone]     (approximate)  I have reviewed the triage vital signs and the nursing notes.   HISTORY  Chief Complaint Respiratory distress  Level V caveat: Limited by distress   HPI Renee Gonzalez is a 32 y.o. female who presents to the ED from home with a chief complaint of respiratory distress.  Patient has a history of asthma, prior hospitalization she thinks approximately 1 to 2 years ago; never intubation.  Spent the day at a friend's house who has cats.  Presents with acute respiratory distress with auditory wheezing and associated chest tightness.  Denies recent fever, chills, cough, abdominal pain, nausea or vomiting.   Past Medical History:  Diagnosis Date  . Asthma     Patient Active Problem List   Diagnosis Date Noted  . Asthma in adult, severe persistent, with acute exacerbation 10/17/2018    Past Surgical History:  Procedure Laterality Date  . SHOULDER SURGERY Right     Prior to Admission medications   Medication Sig Start Date End Date Taking? Authorizing Provider  albuterol (PROVENTIL) (2.5 MG/3ML) 0.083% nebulizer solution Take 2.5 mg by nebulization every 6 (six) hours as needed for wheezing or shortness of breath.   Yes [provider]  montelukast (SINGULAIR) 10 MG tablet Take 10 mg by mouth at bedtime.   Yes [provider]  albuterol (PROVENTIL HFA;VENTOLIN HFA) 108 (90 Base) MCG/ACT inhaler 2 puffs every 4-6 hours prn wheezing, cough 12/15/16   Payton Mccallumonty, [place], MD  diphenhydrAMINE (BENADRYL) 50 MG tablet Take 50 mg by mouth every 6 (six) hours as needed for itching.    [provider]  guaiFENesin (MUCINEX) 600 MG 12 hr tablet Take 600 mg by mouth 2 (two) times daily.    [provider]  loratadine (CLARITIN) 10 MG tablet Take 10 mg by mouth daily.     [provider]  predniSONE (STERAPRED UNI-PAK 21 TAB) 10 MG (21) TBPK tablet Dispense steroid taper pack as directed Patient not taking: Reported on 10/17/2018 03/19/17   Emily FilbertWilliams, Jonathan E, MD  PRESCRIPTION MEDICATION Take 1 tablet by mouth daily. [place] birth control microgynon 30    [provider]  tetrahydrozoline-zinc (VISINE-AC) 0.05-0.25 % ophthalmic solution Place 2 drops into both eyes 3 (three) times daily as needed.    [provider]    Allergies Patient has no known allergies.  Family History  Problem Relation Age of Onset  . Diabetes Mellitus II Mother     Social History Social History   Tobacco Use  . Smoking status: Never Smoker  . Smokeless tobacco: Never Used  Substance Use Topics  . Alcohol use: Yes  . Drug use: Not on file    Review of Systems  Constitutional: No fever/chills Eyes: No visual changes. ENT: No sore throat. Cardiovascular: Positive for chest pain. Respiratory: Positive for shortness of breath. Gastrointestinal: No abdominal pain.  No nausea, no vomiting.  No diarrhea.  No constipation. Genitourinary: Negative for dysuria. Musculoskeletal: Negative for back pain. Skin: Negative for rash. Neurological: Negative for headaches, focal weakness or numbness.   ____________________________________________   PHYSICAL EXAM:  VITAL SIGNS: ED Triage Vitals  Enc Vitals Group     BP      Pulse      Resp      Temp      Temp src      SpO2  Weight      Height      Head Circumference      Peak Flow      Pain Score      Pain Loc      Pain Edu?      Excl. in GC?     Constitutional: Alert and oriented.  Ill appearing and in moderate to severe acute distress. Eyes: Conjunctivae are normal. PERRL. EOMI. Head: Atraumatic. Nose: No congestion/rhinnorhea. Mouth/Throat: Mucous membranes are moist.  Oropharynx non-erythematous. Neck: No stridor.   Cardiovascular: Normal rate, regular rhythm. Grossly  normal heart sounds.  Good peripheral circulation. Respiratory: Increased respiratory effort.  Retractions.  Tripoding.  Lungs diminished bilaterally with wheezing. Gastrointestinal: Soft and nontender. No distention. No abdominal bruits. No CVA tenderness. Musculoskeletal: No lower extremity tenderness nor edema.  No joint effusions. Neurologic:  Normal speech and language. No gross focal neurologic deficits are appreciated.  Skin:  Skin is warm, dry and intact. No rash noted. Psychiatric: Mood and affect are normal. Speech and behavior are normal.  ____________________________________________   LABS (all labs ordered are listed, but only abnormal results are displayed)  Labs Reviewed  CBC WITH DIFFERENTIAL/PLATELET - Abnormal; Notable for the following components:      Result Value   WBC 16.4 (*)    Neutro Abs 12.4 (*)    Abs Immature Granulocytes 0.14 (*)    All other components within normal limits  BASIC METABOLIC PANEL - Abnormal; Notable for the following components:   CO2 21 (*)    Glucose, Bld 151 (*)    All other components within normal limits  TROPONIN I  BLOOD GAS, ARTERIAL   ____________________________________________  EKG  ED ECG REPORT I, Seraphim Affinito J, the attending physician, personally viewed and interpreted this ECG.   Date: 10/17/2018  EKG Time: 0613  Rate: 140  Rhythm: sinus tachycardia  Axis: Normal  Intervals:left anterior fascicular block  ST&T Change: Nonspecific  ____________________________________________  RADIOLOGY  ED MD interpretation: Mild peribronchial thickening  Official radiology report(s): Dg Chest Port 1 View  Result Date: 10/17/2018 CLINICAL DATA:  Acute onset of respiratory distress. EXAM: PORTABLE CHEST 1 VIEW COMPARISON:  Chest radiograph performed 03/19/2017 FINDINGS: The lungs are well-aerated. Mild peribronchial thickening is noted. Minimal left basilar atelectasis is noted. There is no evidence of pleural effusion or  pneumothorax. The cardiomediastinal silhouette is within normal limits. No acute osseous abnormalities are seen. IMPRESSION: Mild peribronchial thickening noted; minimal left basilar atelectasis seen. Electronically Signed   By: Roanna Raider M.D.   On: 10/17/2018 05:57    ____________________________________________   PROCEDURES  Procedure(s) performed: None  Procedures  Critical Care performed: Yes, see critical care note(s)   CRITICAL CARE Performed by: Irean Hong   Total critical care time: 45 minutes  Critical care time was exclusive of separately billable procedures and treating other patients.  Critical care was necessary to treat or prevent imminent or life-threatening deterioration.  Critical care was time spent personally by me on the following activities: development of treatment plan with patient and/or surrogate as well as nursing, discussions with consultants, evaluation of patient's response to treatment, examination of patient, obtaining history from patient or surrogate, ordering and performing treatments and interventions, ordering and review of laboratory studies, ordering and review of radiographic studies, pulse oximetry and re-evaluation of patient's condition.  ____________________________________________   INITIAL IMPRESSION / ASSESSMENT AND PLAN / ED COURSE  As part of my medical decision making, I reviewed the following data within the electronic  MEDICAL RECORD NUMBER History obtained from family, Nursing notes reviewed and incorporated, Labs reviewed, EKG interpreted, Old chart reviewed, Radiograph reviewed, Discussed with admitting physician and Notes from prior ED visits   32 year old female with history of asthma who presents with severe asthma exacerbation. Differential includes, but is not limited to, viral syndrome, bronchitis including COPD exacerbation, pneumonia, reactive airway disease including asthma, CHF including exacerbation with or without  pulmonary/interstitial edema, pneumothorax, ACS, thoracic trauma, and pulmonary embolism.  Room air saturations 80%.  Will place on BiPAP, administer 125 mg IV Solu-Medrol, 3 stacked duo nebs, 2 g IV magnesium.  Anticipate hospitalization.   Clinical Course as of Oct 17 641  Wynelle Link Oct 17, 2018  0606 Looking slightly better on BiPAP.  Pacer pads placed.  Discussed with hospitalist to evaluate patient in emergency department for admission.   [JS]    Clinical Course User Index [JS] Irean Hong, MD     ____________________________________________   FINAL CLINICAL IMPRESSION(S) / ED DIAGNOSES  Final diagnoses:  Respiratory distress  Hypoxia  Severe persistent asthma with acute exacerbation  Acute on chronic respiratory failure, unspecified whether with hypoxia or hypercapnia [facility])     ED Discharge Orders    None       Note:  This document was prepared using Dragon voice recognition software and may include unintentional dictation errors.    Irean Hong, MD 10/17/18 [phone]

## 2018-10-17 NOTE — Progress Notes (Signed)
Advanced care plan.  Purpose of the Encounter: CODE STATUS  Parties in Attendance: Patient  Patient's Decision Capacity: Good  Subjective/Patient's story: Presented to emergency room for shortness of breath   Objective/Medical story Has hypoxic respiratory distress secondary to asthma exacerbation Needs IV steroids and aggressive nebulization treatments and BiPAP   Goals of care determination:  Advance care directives goals of care discussed Patient wants everything done which includes CPR, intubation ventilator if the need arises   CODE STATUS: Full code   Time spent discussing advanced care planning: 16 minutes

## 2018-10-17 NOTE — ED Notes (Signed)
This RN and Pattricia BossAnnie, RN to bedside to introduce selves to patient. Pt and family requesting to speak to Dr. Sheryle Hailiamond regarding options for BiPap and admission to ICU due to patient not having insurance. This RN notified Dr. Sheryle Hailiamond of patient request. Pt is alert and oriented, resting comfortably on BiPap at this time.

## 2018-10-18 LAB — HEMOGLOBIN A1C
Hgb A1c MFr Bld: 5.5 % (ref 4.8–5.6)
Mean Plasma Glucose: 111 mg/dL

## 2018-10-18 MED ORDER — PREDNISONE 10 MG PO TABS: 10.0000 mg | ORAL_TABLET | Freq: Every day | ORAL | 0 refills | Status: DC

## 2018-10-18 MED ORDER — SODIUM CHLORIDE 0.9 % IV SOLN
INTRAVENOUS | Status: DC
  Administered 2018-10-18: 03:00:00 via INTRAVENOUS

## 2018-10-18 MED ORDER — AZITHROMYCIN 250 MG PO TABS
500.0000 mg | ORAL_TABLET | Freq: Every day | ORAL | Status: DC
  Administered 2018-10-18: 500 mg via ORAL
  Filled 2018-10-18: qty 2

## 2018-10-18 MED ORDER — AZITHROMYCIN 250 MG PO TABS: ORAL_TABLET | ORAL | 0 refills | Status: AC

## 2018-10-18 MED ORDER — ALBUTEROL SULFATE (2.5 MG/3ML) 0.083% IN NEBU: 2.5000 mg | INHALATION_SOLUTION | RESPIRATORY_TRACT | 0 refills | Status: AC | PRN

## 2018-10-18 NOTE — Discharge Summary (Addendum)
[facility] - [place] at [facility]   PATIENT NAME: Renee Gonzalez    MR#:  [id number]  DATE OF BIRTH:  [birth year]  DATE OF ADMISSION:  10/17/2018 ADMITTING PHYSICIAN: Arnaldo NatalMichael S Diamond, MD  DATE OF DISCHARGE: 10/18/2018  PRIMARY CARE PHYSICIAN: Patient, No Pcp Per   ADMISSION DIAGNOSIS:  Respiratory distress [R06.03] Hypoxia [R09.02] Severe persistent asthma with acute exacerbation [J45.51] Acute on chronic respiratory failure, unspecified whether with hypoxia or hypercapnia (HCC) [J96.20]  DISCHARGE DIAGNOSIS:  Active Problems:   Asthma in adult, severe persistent, with acute exacerbation Hypoxia Acute asthma exacerbation  SECONDARY DIAGNOSIS:   Past Medical History:  Diagnosis Date  . Asthma      ADMITTING HISTORY The patient with past medical history of asthma presents to the emergency department by private vehicle after awakening at 5 AM unable to breathe.  The patient had returned home approximately midnight with shortness of breath.  She tried using her albuterol inhaler which helped briefly but the patient continued to wheeze and have increased work of breathing.  Upon arrival she was tripod positioned and moving very little air.  She was placed on BiPAP after receiving Solu-Medrol.  Multiple breathing treatments were administered which improved air movement but not work of breathing which prompted the emergency department staff to call the hospitalist service for admission.  HOSPITAL COURSE:  Patient was admitted to stepdown unit.  Initially she was put on BiPAP and received IV Solu-Medrol and nebulization treatments aggressively.  Patient received Zithromax antibiotic.  She responded to IV steroids and breathing treatments.  Shortness of breath and wheezing improved.  She was weaned of BiPAP and transferred to medical floor.  CONSULTS OBTAINED:    DRUG ALLERGIES:  No Known Allergies  DISCHARGE MEDICATIONS:   Allergies as of 10/18/2018   No Known  Allergies     Medication List    STOP taking these medications   predniSONE 10 MG (21) Tbpk tablet Commonly known as:  STERAPRED UNI-PAK 21 TAB Replaced by:  predniSONE 10 MG tablet     TAKE these medications   albuterol 108 (90 Base) MCG/ACT inhaler Commonly known as:  PROVENTIL HFA;VENTOLIN HFA 2 puffs every 4-6 hours prn wheezing, cough What changed:  Another medication with the same name was changed. Make sure you understand how and when to take each.   albuterol (2.5 MG/3ML) 0.083% nebulizer solution Commonly known as:  PROVENTIL Take 3 mLs (2.5 mg total) by nebulization every 4 (four) hours as needed for wheezing or shortness of breath. What changed:  when to take this   azithromycin 250 MG tablet Commonly known as:  ZITHROMAX Take 2 tablets (500 mg) on  Day 1,  followed by 1 tablet (250 mg) once daily on Days 2 through 5.   levonorgestrel-ethinyl estradiol 0.15-30 MG-MCG tablet Commonly known as:  NORDETTE Take 1 tablet by mouth daily.   loratadine 10 MG tablet Commonly known as:  CLARITIN Take 10 mg by mouth daily.   predniSONE 10 MG tablet Commonly known as:  DELTASONE Take 1 tablet (10 mg total) by mouth daily. Label  & dispense according to the schedule below.  6 tablets day one, then 5 table day 2, then 4 tablets day 3, then 3 tablets day 4, 2 tablets day 5, then 1 tablet day 6, then stop Replaces:  predniSONE 10 MG (21) Tbpk tablet   pseudoephedrine 30 MG tablet Commonly known as:  SUDAFED Take 30 mg by mouth every 4 (four) hours as needed for  congestion.       Today  Patient seen and evaluated today Decreased shortness of breath and wheezing Comfortable on room air Tolerated diet well VITAL SIGNS:  Blood pressure 121/68, pulse (!) 101, temperature 98.2 F (36.8 C), temperature source Oral, resp. rate 18, height 5\' 3"  (1.6 m), weight 76.7 kg, SpO2 98 %.  I/O:    Intake/Output Summary (Last 24 hours) at 10/18/2018 1152 Last data filed at  10/18/2018 0700 Gross per 24 hour  Intake 1469.13 ml  Output 600 ml  Net 869.13 ml    PHYSICAL EXAMINATION:  Physical Exam  GENERAL:  32 y.o.-year-old patient lying in the bed with no acute distress.  LUNGS: Normal breath sounds bilaterally, no wheezing, rales,rhonchi or crepitation. No use of accessory muscles of respiration.  CARDIOVASCULAR: S1, S2 normal. No murmurs, rubs, or gallops.  ABDOMEN: Soft, non-tender, non-distended. Bowel sounds present. No organomegaly or mass.  NEUROLOGIC: Moves all 4 extremities. PSYCHIATRIC: The patient is alert and oriented x 3.  SKIN: No obvious rash, lesion, or ulcer.   DATA REVIEW:   CBC Recent Labs  Lab 10/17/18 0559  WBC 16.4*  HGB 14.5  HCT 43.4  PLT 379    Chemistries  Recent Labs  Lab 10/17/18 0559  NA 145  K 4.0  CL 110  CO2 21*  GLUCOSE 151*  BUN 10  CREATININE 0.76  CALCIUM 10.3    Cardiac Enzymes Recent Labs  Lab 10/17/18 0559  TROPONINI <0.03    Microbiology Results  Results for orders placed or performed during the hospital encounter of 10/17/18  MRSA PCR Screening     Status: None   Collection Time: 10/17/18  8:28 AM  Result Value Ref Range Status   MRSA by PCR NEGATIVE NEGATIVE Final    Comment:        The GeneXpert MRSA Assay (FDA approved for NASAL specimens only), is one component of a comprehensive MRSA colonization surveillance program. It is not intended to diagnose MRSA infection nor to guide or monitor treatment for MRSA infections. Performed at [facility], [address] [place] [place], [place] [address]     RADIOLOGY:  Dg Chest Port 1 View  Result Date: 10/17/2018 CLINICAL DATA:  Acute onset of respiratory distress. EXAM: PORTABLE CHEST 1 VIEW COMPARISON:  Chest radiograph performed 03/19/2017 FINDINGS: The lungs are well-aerated. Mild peribronchial thickening is noted. Minimal left basilar atelectasis is noted. There is no evidence of pleural effusion or pneumothorax. The  cardiomediastinal silhouette is within normal limits. No acute osseous abnormalities are seen. IMPRESSION: Mild peribronchial thickening noted; minimal left basilar atelectasis seen. Electronically Signed   By: Roanna Raider M.D.   On: 10/17/2018 05:57    Follow up with PCP in 1 week.  Management plans discussed with the patient, family and they are in agreement.  CODE STATUS: Full code    Code Status Orders  (From admission, onward)         Start     Ordered   10/17/18 0738  Full code  Continuous     10/17/18 0737        Code Status History    This patient has a current code status but no historical code status.      TOTAL TIME TAKING CARE OF THIS PATIENT ON DAY OF DISCHARGE: more than 33 minutes.   Ihor Austin M.D on 10/18/2018 at 11:52 AM  Between 7am to 6pm - Pager - [phone]  After 6pm go to [email or web address] - password [email or web address]  [facility]  [facility] [place] [facility]  Office  [phone]  CC: Primary care physician; Patient, No Pcp Per  Note: This dictation was prepared with Dragon dictation along with smaller phrase technology. Any transcriptional errors that result from this process are unintentional.

## 2018-10-18 NOTE — Plan of Care (Signed)
  Problem: Clinical Measurements: Goal: Respiratory complications will improve Outcome: Progressing Note:  Now on room air   Problem: Activity: Goal: Risk for activity intolerance will decrease Outcome: Progressing Note:  Independent in room, tolerating well   Problem: Safety: Goal: Ability to remain free from injury will improve Outcome: Progressing   Problem: Skin Integrity: Goal: Risk for impaired skin integrity will decrease Outcome: Progressing

## 2018-10-18 NOTE — Progress Notes (Signed)
PHARMACIST - PHYSICIAN COMMUNICATION DR:   Tobi BastosPyreddy CONCERNING: Antibiotic IV to Oral Route Change Policy  RECOMMENDATION: This patient is receiving azithromycin by the intravenous route.  Based on criteria approved by the Pharmacy and Therapeutics Committee, the antibiotic(s) is/are being converted to the equivalent oral dose form(s).   DESCRIPTION: These criteria include:  Patient being treated for a respiratory tract infection, urinary tract infection, cellulitis or clostridium difficile associated diarrhea if on metronidazole  The patient is not neutropenic and does not exhibit a GI malabsorption state  The patient is eating (either orally or via tube) and/or has been taking other orally administered medications for a least 24 hours  The patient is improving clinically and has a Tmax < 100.5  If you have questions about this conversion, please contact the Pharmacy Department  []   [phone]( [phone] )  [facility] []   [phone]  [facility] []   [phone]( [phone] )  [facility] []   [phone]( [phone]  [facility] []   [phone]( [phone] )  [facility]

## 2018-10-18 NOTE — Progress Notes (Signed)
Malka SoAceneth Klabunde to be D/C'd Home per MD order.  Discussed prescriptions and follow up appointments with the patient. Prescriptions given to patient, medication list explained in detail. Pt verbalized understanding.  Allergies as of 10/18/2018   No Known Allergies     Medication List    STOP taking these medications   predniSONE 10 MG (21) Tbpk tablet Commonly known as:  STERAPRED UNI-PAK 21 TAB Replaced by:  predniSONE 10 MG tablet     TAKE these medications   albuterol 108 (90 Base) MCG/ACT inhaler Commonly known as:  PROVENTIL HFA;VENTOLIN HFA 2 puffs every 4-6 hours prn wheezing, cough What changed:  Another medication with the same name was changed. Make sure you understand how and when to take each.   albuterol (2.5 MG/3ML) 0.083% nebulizer solution Commonly known as:  PROVENTIL Take 3 mLs (2.5 mg total) by nebulization every 4 (four) hours as needed for wheezing or shortness of breath. What changed:  when to take this   azithromycin 250 MG tablet Commonly known as:  ZITHROMAX Take 2 tablets (500 mg) on  Day 1,  followed by 1 tablet (250 mg) once daily on Days 2 through 5.   levonorgestrel-ethinyl estradiol 0.15-30 MG-MCG tablet Commonly known as:  NORDETTE Take 1 tablet by mouth daily.   loratadine 10 MG tablet Commonly known as:  CLARITIN Take 10 mg by mouth daily.   predniSONE 10 MG tablet Commonly known as:  DELTASONE Take 1 tablet (10 mg total) by mouth daily. Label  & dispense according to the schedule below.  6 tablets day one, then 5 table day 2, then 4 tablets day 3, then 3 tablets day 4, 2 tablets day 5, then 1 tablet day 6, then stop Replaces:  predniSONE 10 MG (21) Tbpk tablet   pseudoephedrine 30 MG tablet Commonly known as:  SUDAFED Take 30 mg by mouth every 4 (four) hours as needed for congestion.       Vitals:   10/18/18 0910 10/18/18 1127  BP:    Pulse:    Resp:    Temp:    SpO2: 95% 98%    Tele box removed and returned. Skin clean, dry and  intact without evidence of skin break down, no evidence of skin tears noted. IV catheter discontinued intact. Site without signs and symptoms of complications. Dressing and pressure applied. Pt denies pain at this time. No complaints noted.  An After Visit Summary was printed and given to the patient. Patient escorted via WC, and D/C home via private auto.  Rigoberto NoelErica Y Tamieka Rancourt

## 2018-10-21 ENCOUNTER — Telehealth: Payer: Self-pay

## 2018-10-21 NOTE — Telephone Encounter (Signed)
Flagged on EMMI report for not having a follow up scheduled.  Called and spoke with patient.  She mentioned she does not have a doctor right now due to not having insurance. Reviewed [facility] and services available.  Encouraged patient to go to [facility] website for more information and to find application.  No other questions or issues at this time.  I thanked her for her time and informed her that she would receive one more automated call checking in over the next few days.

## 2018-11-07 ENCOUNTER — Encounter: Payer: Self-pay | Admitting: Emergency Medicine

## 2018-11-07 ENCOUNTER — Emergency Department
Admission: EM | Admit: 2018-11-07 | Discharge: 2018-11-07 | Disposition: A | Discharge status: Home / Self Care | Payer: Self-pay | Attending: Emergency Medicine | Admitting: Emergency Medicine

## 2018-11-07 DIAGNOSIS — J4541 Moderate persistent asthma with (acute) exacerbation: Secondary | ICD-10-CM | POA: Insufficient documentation

## 2018-11-07 DIAGNOSIS — Z79899 Other long term (current) drug therapy: Secondary | ICD-10-CM | POA: Insufficient documentation

## 2018-11-07 MED ORDER — MAGNESIUM SULFATE 2 GM/50ML IV SOLN
2.0000 g | Freq: Once | INTRAVENOUS | Status: AC
  Administered 2018-11-07: 2 g via INTRAVENOUS

## 2018-11-07 MED ORDER — IPRATROPIUM-ALBUTEROL 0.5-2.5 (3) MG/3ML IN SOLN
3.0000 mL | Freq: Once | RESPIRATORY_TRACT | Status: AC
  Administered 2018-11-07: 3 mL via RESPIRATORY_TRACT

## 2018-11-07 MED ORDER — METHYLPREDNISOLONE SODIUM SUCC 125 MG IJ SOLR
125.0000 mg | Freq: Once | INTRAMUSCULAR | Status: AC
  Administered 2018-11-07: 125 mg via INTRAVENOUS

## 2018-11-07 MED ORDER — ALBUTEROL SULFATE (2.5 MG/3ML) 0.083% IN NEBU: 2.5000 mg | INHALATION_SOLUTION | Freq: Four times a day (QID) | RESPIRATORY_TRACT | 12 refills | Status: DC | PRN

## 2018-11-07 MED ORDER — PREDNISONE 10 MG PO TABS: ORAL_TABLET | ORAL | 0 refills | Status: DC

## 2018-11-07 MED ORDER — MONTELUKAST SODIUM 10 MG PO TABS: 10.0000 mg | ORAL_TABLET | Freq: Every day | ORAL | 2 refills | Status: DC

## 2018-11-07 NOTE — ED Provider Notes (Signed)
[facility] Emergency Department Provider Note  ____________________________________________   First MD Initiated Contact with Patient 11/07/18 [id number]     (approximate)  I have reviewed the triage vital signs and the nursing notes.   HISTORY  Chief Complaint Shortness of Breath    HPI Renee Gonzalez is a 32 y.o. female with a history of asthma who presents by private vehicle with acute and severe shortness of breath.  She states that she has been doing well since her last visit to the emergency department for asthma and has finished her prednisone.  She went out in the cold tonight and feels that cold tends to exacerbate her symptoms.  She tried taking 3 or 4 nebulizer treatments at home and she is not feeling better so she came to the emergency department.  When she initially arrived she was wheezing and using accessory muscles.  Nothing particular is making it better and exertion was making it worse.  She denies fever/chills, chest pain, nausea, vomiting, and abdominal pain.  Past Medical History:  Diagnosis Date  . Asthma     Patient Active Problem List   Diagnosis Date Noted  . Asthma in adult, severe persistent, with acute exacerbation 10/17/2018    Past Surgical History:  Procedure Laterality Date  . SHOULDER SURGERY Right     Prior to Admission medications   Medication Sig Start Date End Date Taking? Authorizing Provider  albuterol (PROVENTIL HFA;VENTOLIN HFA) 108 (90 Base) MCG/ACT inhaler 2 puffs every 4-6 hours prn wheezing, cough 12/15/16  Yes Conty, Orlando, MD  albuterol (PROVENTIL) (2.5 MG/3ML) 0.083% nebulizer solution Take 3 mLs (2.5 mg total) by nebulization every 4 (four) hours as needed for wheezing or shortness of breath. 10/18/18  Yes Pyreddy, Vivien RotaPavan, MD  levonorgestrel-ethinyl estradiol (NORDETTE) 0.15-30 MG-MCG tablet Take 1 tablet by mouth daily. 12/10/17  Yes [provider]  loratadine (CLARITIN) 10 MG tablet Take 10 mg by  mouth daily.   Yes [provider]  pseudoephedrine (SUDAFED) 30 MG tablet Take 30 mg by mouth every 4 (four) hours as needed for congestion.   Yes [provider]  albuterol (PROVENTIL) (2.5 MG/3ML) 0.083% nebulizer solution Take 3 mLs (2.5 mg total) by nebulization every 6 (six) hours as needed for wheezing or shortness of breath. 11/07/18   Loleta RoseForbach, Ronan Dion, MD  montelukast (SINGULAIR) 10 MG tablet Take 1 tablet (10 mg total) by mouth at bedtime. 11/07/18 11/07/19  Loleta RoseForbach, Teryn Boerema, MD  predniSONE (DELTASONE) 10 MG tablet Take 4 tabs (40 mg) PO x 3 days, then take 2 tabs (20 mg) PO x 3 days, then take 1 tab (10 mg) PO x 3 days, then take 1/2 tab (5 mg) PO x 4 days. 11/07/18   Loleta RoseForbach, Jennell Janosik, MD    Allergies Patient has no known allergies.  Family History  Problem Relation Age of Onset  . Diabetes Mellitus II Mother     Social History Social History   Tobacco Use  . Smoking status: Never Smoker  . Smokeless tobacco: Never Used  Substance Use Topics  . Alcohol use: Yes  . Drug use: Not on file    Review of Systems Constitutional: No fever/chills Eyes: No visual changes. ENT: No sore throat. Cardiovascular: Denies chest pain. Respiratory: Shortness of breath as described above Gastrointestinal: No abdominal pain.  No nausea, no vomiting.  No diarrhea.  No constipation. Genitourinary: Negative for dysuria. Musculoskeletal: Negative for neck pain.  Negative for back pain. Integumentary: Negative for rash. Neurological: Negative for  headaches, focal weakness or numbness.   ____________________________________________   PHYSICAL EXAM:  VITAL SIGNS: ED Triage Vitals  Enc Vitals Group     BP 11/07/18 0120 131/89     Pulse Rate 11/07/18 0116 (!) 122     Resp 11/07/18 0116 (!) 28     Temp 11/07/18 0116 98.1 F (36.7 C)     Temp Source 11/07/18 0116 Oral     SpO2 11/07/18 0116 100 %     Weight 11/07/18 0117 76.7 kg (169 lb 1.5 oz)     Height 11/07/18 0117 1.6 m  (5\' 3" )     Head Circumference --      Peak Flow --      Pain Score 11/07/18 0117 0     Pain Loc --      Pain Edu? --      Excl. in GC? --    (Note that examination was posttreatment) Constitutional: Alert and oriented. Well appearing and in no acute distress. Eyes: Conjunctivae are normal.  Head: Atraumatic. Nose: No congestion/rhinnorhea. Mouth/Throat: Mucous membranes are moist. Neck: No stridor.  No meningeal signs.   Cardiovascular: Tachycardia after albuterol treatments, regular rhythm. Good peripheral circulation. Grossly normal heart sounds. Respiratory: Normal respiratory effort.  No retractions.  Minimal residual expiratory wheezing, but overall clear lungs with good air movement. Gastrointestinal: Soft and nontender. No distention.  Musculoskeletal: No lower extremity tenderness nor edema. No gross deformities of extremities. Neurologic:  Normal speech and language. No gross focal neurologic deficits are appreciated.  Skin:  Skin is warm, dry and intact. No rash noted. Psychiatric: Mood and affect are normal. Speech and behavior are normal.  ____________________________________________   LABS (all labs ordered are listed, but only abnormal results are displayed)  Labs Reviewed - No data to display ____________________________________________  EKG  No indication for EKG ____________________________________________  RADIOLOGY  ED MD interpretation: No indication for imaging  Official radiology report(s): No results found.  ____________________________________________   PROCEDURES  Critical Care performed: No   Procedure(s) performed:   Procedures   ____________________________________________   INITIAL IMPRESSION / ASSESSMENT AND PLAN / ED COURSE  As part of my medical decision making, I reviewed the following data within the electronic MEDICAL RECORD NUMBER Nursing notes reviewed and incorporated, Labs reviewed , Old chart reviewed and Notes from  prior ED visits    Differential diagnosis includes, but is not limited to, asthma exacerbation, bronchitis, pneumonia, less likely pulmonary embolism.  The patient has known moderate to severe asthma that is exacerbated by cold and that is exactly what happened tonight.  Her medications at home did not help but I suspect there may have been an anxiety component as well.  She received Solu-Medrol 125 mg in the emergency department as well as 3 DuoNeb's.  After that she felt completely better and back to baseline.  Due to multiple other critical patients in the emergency department I saw her after treatment and she felt well and wanted to go home.  I think that is appropriate.  She did not need magnesium and does not need continued monitoring.  I gave her prescription for prednisone as well as additional albuterol nebulizer solution.  I also gave her a prescription for montelukast because she says she was on it previously and it seems to help her but she has not had a prescription for a while since she lost her insurance.  She has the [employer] and knows how to use it to get the best possible  prices on her medications which should help prevent her from having to come back to the emergency department.  I gave my usual and customary return precautions.     ____________________________________________  FINAL CLINICAL IMPRESSION(S) / ED DIAGNOSES  Final diagnoses:  Moderate persistent asthma with exacerbation     MEDICATIONS GIVEN DURING THIS VISIT:  Medications  ipratropium-albuterol (DUONEB) 0.5-2.5 (3) MG/3ML nebulizer solution 3 mL (3 mLs Nebulization Given 11/07/18 0120)  ipratropium-albuterol (DUONEB) 0.5-2.5 (3) MG/3ML nebulizer solution 3 mL (3 mLs Nebulization Given 11/07/18 0120)  ipratropium-albuterol (DUONEB) 0.5-2.5 (3) MG/3ML nebulizer solution 3 mL (3 mLs Nebulization Given 11/07/18 0119)  methylPREDNISolone sodium succinate (SOLU-MEDROL) 125 mg/2 mL injection 125 mg (125 mg Intravenous  Given 11/07/18 0120)  magnesium sulfate IVPB 2 g 50 mL (0 g Intravenous Stopped 11/07/18 0406)     ED Discharge Orders         Ordered    albuterol (PROVENTIL) (2.5 MG/3ML) 0.083% nebulizer solution  Every 6 hours PRN     11/07/18 0349    montelukast (SINGULAIR) 10 MG tablet  Daily at bedtime     11/07/18 0349    predniSONE (DELTASONE) 10 MG tablet     11/07/18 0349           Note:  This document was prepared using Dragon voice recognition software and may include unintentional dictation errors.    Loleta Rose, MD 11/07/18 [phone]

## 2018-11-07 NOTE — ED Triage Notes (Signed)
Patient with complaint of asthma exacerbation times one hour. Patient states that she has used nebulizer at home with no improvement. Patient with audible wheezing.

## 2018-11-07 NOTE — Discharge Instructions (Addendum)
We believe that your symptoms are caused today by an exacerbation of your asthma.  Please take the prescribed medications and any medications that you have at home.  Follow up with your doctor as recommended.  If you develop any new or worsening symptoms, including but not limited to fever, persistent vomiting, worsening shortness of breath, or other symptoms that concern you, please return to the Emergency Department immediately.  

## 2019-12-22 IMAGING — DX DG CHEST 1V PORT
1 series · 1 of 1 positions shown · non-contrast
Comparison: Chest radiograph performed 03/19/2017

CLINICAL DATA: Acute onset of respiratory distress.

EXAM:
PORTABLE CHEST 1 VIEW

[chest ap]
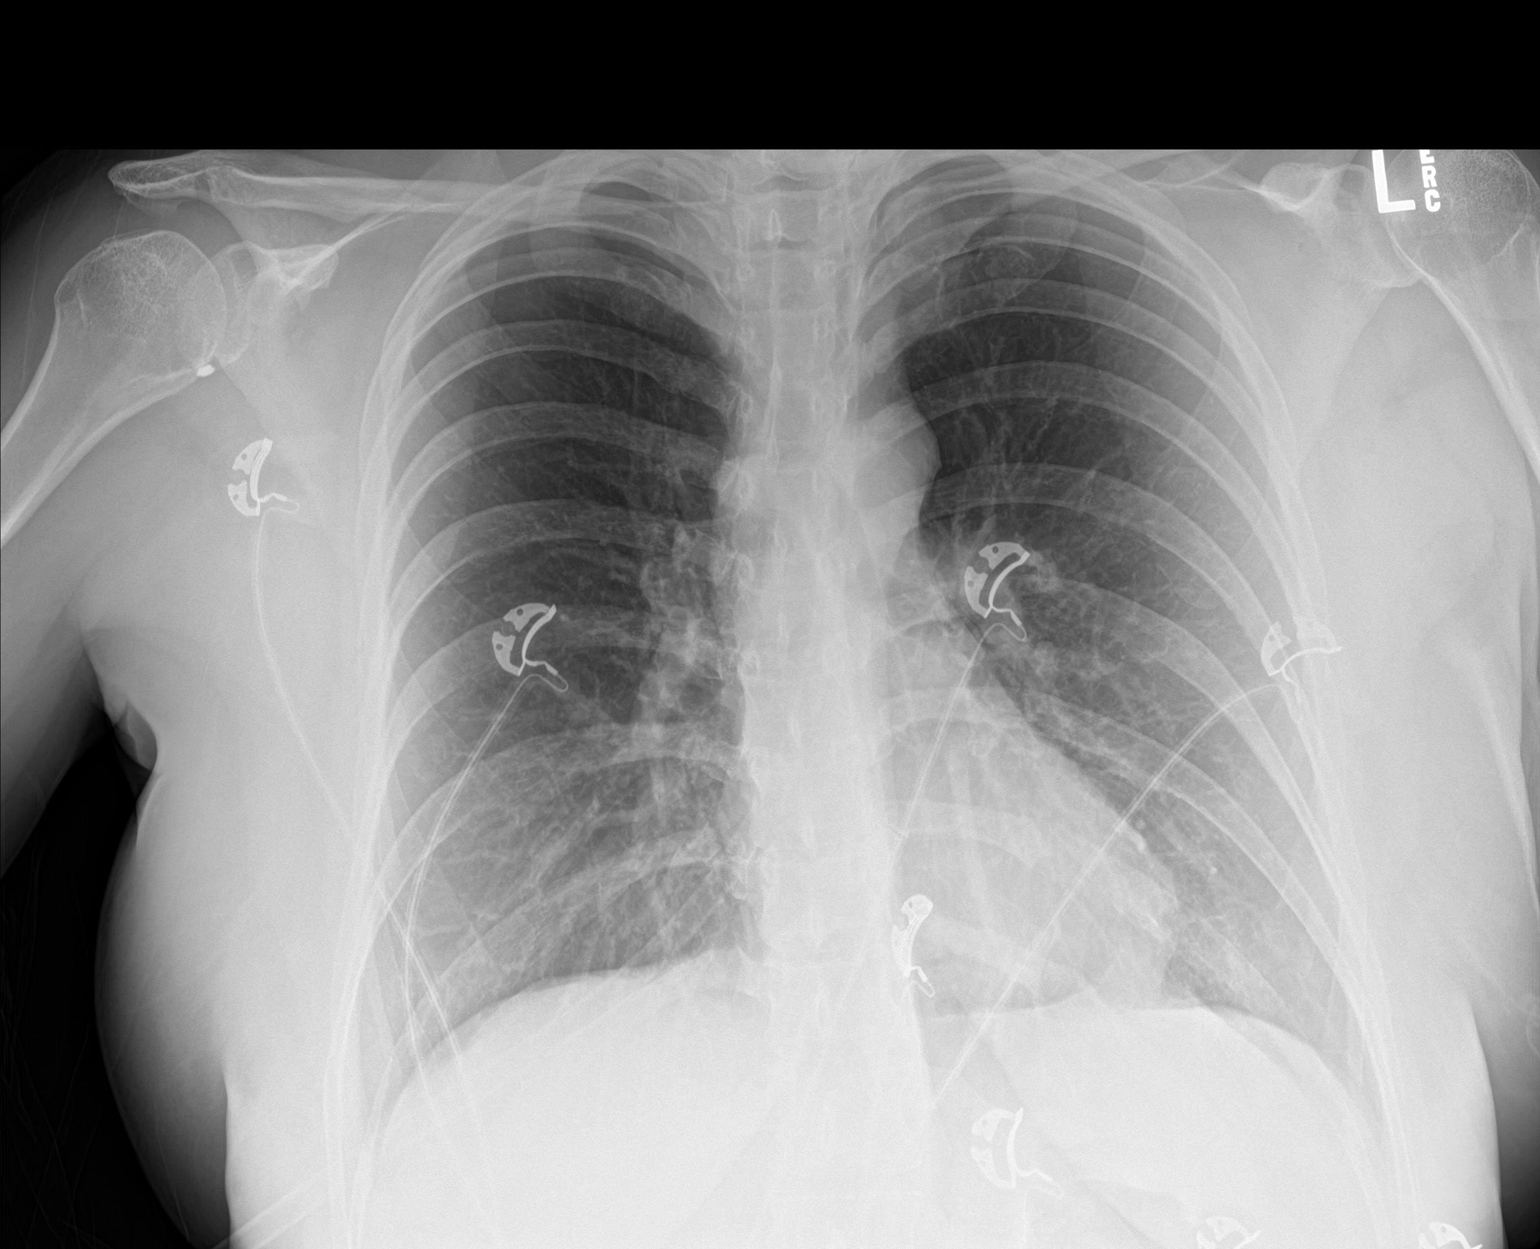

[1 of 1 positions shown; findings below may reference images not displayed]

FINDINGS: The lungs are well-aerated. Mild peribronchial thickening is noted.
Minimal left basilar atelectasis is noted. There is no evidence of
pleural effusion or pneumothorax.

The cardiomediastinal silhouette is within normal limits. No acute
osseous abnormalities are seen.
IMPRESSION: Mild peribronchial thickening noted; minimal left basilar
atelectasis seen.

## 2020-10-15 ENCOUNTER — Ambulatory Visit
Admission: EM | Admit: 2020-10-15 | Discharge: 2020-10-15 | Disposition: A | Discharge status: Home / Self Care | Payer: BC Managed Care – PPO | Attending: Emergency Medicine | Admitting: Emergency Medicine

## 2020-10-15 DIAGNOSIS — R1031 Right lower quadrant pain: Secondary | ICD-10-CM | POA: Diagnosis not present

## 2020-10-15 LAB — POCT URINALYSIS DIP (MANUAL ENTRY)
Bilirubin, UA: NEGATIVE
Blood, UA: NEGATIVE
Glucose, UA: NEGATIVE mg/dL
Ketones, POC UA: NEGATIVE mg/dL
Leukocytes, UA: NEGATIVE
Nitrite, UA: NEGATIVE
Protein Ur, POC: NEGATIVE mg/dL
Spec Grav, UA: 1.015 (ref 1.010–1.025)
Urobilinogen, UA: 0.2 E.U./dL
pH, UA: 6 (ref 5.0–8.0)

## 2020-10-15 MED ORDER — METHOCARBAMOL 500 MG PO TABS: 500.0000 mg | ORAL_TABLET | Freq: Two times a day (BID) | ORAL | 0 refills | Status: DC

## 2020-10-15 NOTE — Discharge Instructions (Signed)
Take the methocarbamol as directed.  Do not drive, operate machinery, or drink alcohol with this medication as it may cause drowsiness.    Go to the emergency department if you have acute pain or other concerning symptoms.

## 2020-10-15 NOTE — ED Provider Notes (Signed)
Renaldo Fiddler    CSN: [id number] Arrival date & time: 10/15/20  1425      History   Chief Complaint Chief Complaint  Patient presents with  . Groin Pain    HPI Renee Gonzalez is a 34 y.o. female.   Patient presents with pain in her right groin x1 week.  She states the pain is worse with movement.  No falls or injury.  She states she had similar symptoms a few months ago while she was in [place] and was given methocarbamol.  She had one leftover and took it yesterday with good relief.  She attempted treatment today with ibuprofen without relief.  She also reports dysuria and malodorous urine since this morning.  She denies rash, wounds, fever, chest pain, abdominal pain, vaginal discharge, pelvic pain, dyspareunia, or other symptoms.    Her medical history includes asthma.  The history is provided by the patient and medical records.    Past Medical History:  Diagnosis Date  . Asthma     Patient Active Problem List   Diagnosis Date Noted  . Asthma in adult, severe persistent, with acute exacerbation 10/17/2018    Past Surgical History:  Procedure Laterality Date  . SHOULDER SURGERY Right     OB History   No obstetric history on file.      Home Medications    Prior to Admission medications   Medication Sig Start Date End Date Taking? Authorizing Provider  albuterol (PROVENTIL HFA;VENTOLIN HFA) 108 (90 Base) MCG/ACT inhaler 2 puffs every 4-6 hours prn wheezing, cough 12/15/16   Payton Mccallum, MD  albuterol (PROVENTIL) (2.5 MG/3ML) 0.083% nebulizer solution Take 3 mLs (2.5 mg total) by nebulization every 4 (four) hours as needed for wheezing or shortness of breath. 10/18/18   Ihor Austin, MD  albuterol (PROVENTIL) (2.5 MG/3ML) 0.083% nebulizer solution Take 3 mLs (2.5 mg total) by nebulization every 6 (six) hours as needed for wheezing or shortness of breath. 11/07/18   Loleta Rose, MD  levonorgestrel-ethinyl estradiol (NORDETTE) 0.15-30 MG-MCG tablet Take 1  tablet by mouth daily. 12/10/17   [provider]  loratadine (CLARITIN) 10 MG tablet Take 10 mg by mouth daily.    [provider]  methocarbamol (ROBAXIN) 500 MG tablet Take 1 tablet (500 mg total) by mouth 2 (two) times daily. 10/15/20   Mickie Bail, NP  montelukast (SINGULAIR) 10 MG tablet Take 1 tablet (10 mg total) by mouth at bedtime. 11/07/18 11/07/19  Loleta Rose, MD  predniSONE (DELTASONE) 10 MG tablet Take 4 tabs (40 mg) PO x 3 days, then take 2 tabs (20 mg) PO x 3 days, then take 1 tab (10 mg) PO x 3 days, then take 1/2 tab (5 mg) PO x 4 days. 11/07/18   Loleta Rose, MD  pseudoephedrine (SUDAFED) 30 MG tablet Take 30 mg by mouth every 4 (four) hours as needed for congestion.    [provider]    Family History Family History  Problem Relation Age of Onset  . Diabetes Mellitus II Mother     Social History Social History   Tobacco Use  . Smoking status: Never Smoker  . Smokeless tobacco: Never Used  Substance Use Topics  . Alcohol use: Yes  . Drug use: Not on file     Allergies   Patient has no known allergies.   Review of Systems Review of Systems  Constitutional: Negative for chills and fever.  HENT: Negative for ear pain and sore throat.  Eyes: Negative for pain and visual disturbance.  Respiratory: Negative for cough and shortness of breath.   Cardiovascular: Negative for chest pain and palpitations.  Gastrointestinal: Negative for abdominal pain, constipation, diarrhea, nausea and vomiting.       Right groin pain.  Genitourinary: Positive for dysuria and frequency. Negative for flank pain, hematuria, pelvic pain and vaginal discharge.  Musculoskeletal: Negative for arthralgias and back pain.  Skin: Negative for color change and rash.  Neurological: Negative for seizures and syncope.  All other systems reviewed and are negative.    Physical Exam Triage Vital Signs ED Triage Vitals  Enc Vitals Group     BP --      Pulse  Rate 10/15/20 1530 81     Resp 10/15/20 1530 16     Temp 10/15/20 1530 98.9 F (37.2 C)     Temp Source 10/15/20 1530 Oral     SpO2 10/15/20 1530 99 %     Weight 10/15/20 1531 167 lb (75.8 kg)     Height 10/15/20 1531 5\' 2"  (1.575 m)     Head Circumference --      Peak Flow --      Pain Score 10/15/20 1531 6     Pain Loc --      Pain Edu? --      Excl. in GC? --    No data found.  Updated Vital Signs Pulse 81   Temp 98.9 F (37.2 C) (Oral)   Resp 16   Ht 5\' 2"  (1.575 m)   Wt 167 lb (75.8 kg)   LMP 09/24/2020   SpO2 99%   BMI 30.54 kg/m   Visual Acuity Right Eye Distance:   Left Eye Distance:   Bilateral Distance:    Right Eye Near:   Left Eye Near:    Bilateral Near:     Physical Exam Vitals and nursing note reviewed.  Constitutional:      General: She is not in acute distress.    Appearance: She is well-developed. She is not ill-appearing.  HENT:     Head: Normocephalic and atraumatic.     Mouth/Throat:     Mouth: Mucous membranes are moist.  Eyes:     Conjunctiva/sclera: Conjunctivae normal.  Cardiovascular:     Rate and Rhythm: Normal rate and regular rhythm.     Heart sounds: No murmur heard.   Pulmonary:     Effort: Pulmonary effort is normal. No respiratory distress.     Breath sounds: Normal breath sounds.  Abdominal:     General: Bowel sounds are normal.     Palpations: Abdomen is soft.     Tenderness: There is no abdominal tenderness. There is no right CVA tenderness, left CVA tenderness, guarding or rebound.    Musculoskeletal:        General: No swelling, tenderness, deformity or signs of injury. Normal range of motion.     Cervical back: Neck supple.  Skin:    General: Skin is warm and dry.     Findings: No bruising, erythema, lesion or rash.  Neurological:     General: No focal deficit present.     Mental Status: She is alert and oriented to person, place, and time.     Gait: Gait normal.  Psychiatric:        Mood and Affect: Mood  normal.        Behavior: Behavior normal.      UC Treatments / Results  Labs (all labs ordered  are listed, but only abnormal results are displayed) Labs Reviewed  POCT URINALYSIS DIP (MANUAL ENTRY)    EKG   Radiology No results found.  Procedures Procedures (including critical care time)  Medications Ordered in UC Medications - No data to display  Initial Impression / Assessment and Plan / UC Course  I have reviewed the triage vital signs and the nursing notes.  Pertinent labs & imaging results that were available during my care of the patient were reviewed by me and considered in my medical decision making (see chart for details).   Right groin pain.  Patient is well-appearing and her exam is reassuring.  Treating with methocarbamol.  Precautions for drowsiness with this medication discussed.  Instructed patient to go to the ED if she has acute pain or other concerning symptoms.  Instructed her to follow-up with her PCP.  Patient agrees to plan of care.   Final Clinical Impressions(s) / UC Diagnoses   Final diagnoses:  Right groin pain     Discharge Instructions     Take the methocarbamol as directed.  Do not drive, operate machinery, or drink alcohol with this medication as it may cause drowsiness.    Go to the emergency department if you have acute pain or other concerning symptoms.        ED Prescriptions    Medication Sig Dispense Auth. Provider   methocarbamol (ROBAXIN) 500 MG tablet Take 1 tablet (500 mg total) by mouth 2 (two) times daily. 20 tablet Mickie Bail, NP     I have reviewed the PDMP during this encounter.   Mickie Bail, NP 10/15/20 [phone]

## 2020-10-15 NOTE — ED Triage Notes (Signed)
Pt reports having groin pain x1 week. More painful with movement. No known injury to area.  Also reports having dysuria and foul odor that began this morning.

## 2020-10-22 ENCOUNTER — Other Ambulatory Visit: Payer: Self-pay | Admitting: Family Medicine

## 2020-10-22 DIAGNOSIS — R102 Pelvic and perineal pain: Secondary | ICD-10-CM

## 2020-11-01 ENCOUNTER — Ambulatory Visit: Payer: BC Managed Care – PPO

## 2020-12-03 ENCOUNTER — Ambulatory Visit
Admission: RE | Admit: 2020-12-03 | Discharge: 2020-12-03 | Disposition: A | Discharge status: Home / Self Care | Payer: BC Managed Care – PPO | Source: Ambulatory Visit | Attending: Family Medicine | Admitting: Family Medicine

## 2020-12-03 ENCOUNTER — Other Ambulatory Visit: Payer: Self-pay

## 2020-12-03 DIAGNOSIS — R102 Pelvic and perineal pain: Secondary | ICD-10-CM | POA: Diagnosis present

## 2021-06-04 ENCOUNTER — Other Ambulatory Visit: Payer: Self-pay | Admitting: Family Medicine

## 2021-06-04 ENCOUNTER — Other Ambulatory Visit (HOSPITAL_COMMUNITY): Payer: Self-pay | Admitting: Family Medicine

## 2021-06-04 DIAGNOSIS — Z3201 Encounter for pregnancy test, result positive: Secondary | ICD-10-CM

## 2021-06-17 ENCOUNTER — Other Ambulatory Visit (HOSPITAL_COMMUNITY)
Admission: RE | Admit: 2021-06-17 | Discharge: 2021-06-17 | Disposition: A | Discharge status: Home / Self Care | Payer: BC Managed Care – PPO | Source: Ambulatory Visit | Attending: Obstetrics | Admitting: Obstetrics

## 2021-06-17 ENCOUNTER — Other Ambulatory Visit: Payer: Self-pay

## 2021-06-17 ENCOUNTER — Encounter: Payer: Self-pay | Admitting: Obstetrics

## 2021-06-17 ENCOUNTER — Ambulatory Visit (INDEPENDENT_AMBULATORY_CARE_PROVIDER_SITE_OTHER): Payer: BC Managed Care – PPO | Admitting: Obstetrics

## 2021-06-17 VITALS — BP 112/60 | HR 90 | Wt 182.0 lb

## 2021-06-17 DIAGNOSIS — Z124 Encounter for screening for malignant neoplasm of cervix: Secondary | ICD-10-CM

## 2021-06-17 DIAGNOSIS — O09519 Supervision of elderly primigravida, unspecified trimester: Secondary | ICD-10-CM | POA: Insufficient documentation

## 2021-06-17 DIAGNOSIS — N926 Irregular menstruation, unspecified: Secondary | ICD-10-CM

## 2021-06-17 DIAGNOSIS — Z113 Encounter for screening for infections with a predominantly sexual mode of transmission: Secondary | ICD-10-CM | POA: Insufficient documentation

## 2021-06-17 DIAGNOSIS — O0990 Supervision of high risk pregnancy, unspecified, unspecified trimester: Secondary | ICD-10-CM

## 2021-06-17 DIAGNOSIS — O09511 Supervision of elderly primigravida, first trimester: Secondary | ICD-10-CM | POA: Insufficient documentation

## 2021-06-17 LAB — OB RESULTS CONSOLE GC/CHLAMYDIA: Gonorrhea: NEGATIVE

## 2021-06-17 NOTE — Progress Notes (Signed)
NOB - no concerns. RM 4 °

## 2021-06-17 NOTE — Progress Notes (Signed)
New Obstetric Patient H&P    Chief Complaint: "Desires prenatal care"   History of Present Illness: Patient is a 35 y.o. G1P0 Hispanic or Latino female, LMP 04/22/2021 presents with amenorrhea and positive home pregnancy test. Based on her  LMP, her EDD is Estimated Date of Delivery: 01/27/22 and her EGA is [redacted]w[redacted]d. Cycles are 5. days, regular, and occur approximately every : 27 days. Her last pap smear was about 1 years ago and was no abnormalities.    She had a urine pregnancy test which was positive about 2 week(s)  ago. Her last menstrual period was normal and lasted for  about 5 day(s). Since her LMP she claims she has experienced breast temdermess fatigue.. She denies vaginal bleeding. Her past medical history is noncontributory. Her prior pregnancies are notable for none  Since her LMP, she admits to the use of tobacco products  no She claims she has gained   no pounds since the start of her pregnancy.  There are cats in the home in the home  no  She admits close contact with children on a regular basis  yes  She has had chicken pox in the past unknown She has had Tuberculosis exposures, symptoms, or previously tested positive for TB   no Current or past history of domestic violence. no  Genetic Screening/Teratology Counseling: (Includes patient, baby's father, or anyone in either family with:)   1. Patient's age >/= 45 at [facility]  yes 2. Thalassemia ([place], [place], Mediterranean, or Asian background): MCV<80  no 3. Neural tube defect (meningomyelocele, spina bifida, anencephaly)  no 4. Congenital heart defect  no  5. Down syndrome  no 6. Tay-Sachs (Jewish, [place]  no 7. Canavan's Disease  no 8. Sickle cell disease or trait (African)  no  9. Hemophilia or other blood disorders  no  10. Muscular dystrophy  no  11. Cystic fibrosis  no  12. Huntington's Chorea  no  13. Mental retardation/autism  no 14. Other inherited genetic or chromosomal disorder  no 15.  Maternal metabolic disorder (DM, PKU, etc)  no 16. Patient or FOB with a child with a birth defect not listed above no  16a. Patient or FOB with a birth defect themselves no 17. Recurrent pregnancy loss, or stillbirth  no  18. Any medications since LMP other than prenatal vitamins (include vitamins, supplements, OTC meds, drugs, alcohol)  no 19. Any other genetic/environmental exposure to discuss  no  Infection History:   1. Lives with someone with TB or TB exposed  no  2. Patient or partner has history of genital herpes  no 3. Rash or viral illness since LMP  no 4. History of STI (GC, CT, HPV, syphilis, HIV)  no 5. History of recent travel :  no  Other pertinent information:  no     Review of Systems:10 point review of systems negative unless otherwise noted in HPI  Past Medical History:  Past Medical History:  Diagnosis Date  . Asthma     Past Surgical History:  Past Surgical History:  Procedure Laterality Date  . SHOULDER SURGERY Right     Gynecologic History: Patient's last menstrual period was 04/22/2021 (exact date).  Obstetric History: G1P0  Family History:  Family History  Problem Relation Age of Onset  . Diabetes Mellitus II Mother     Social History:  Social History   Socioeconomic History  . Marital status: Married    Spouse name: Not on file  .  Number of children: Not on file  . Years of education: Not on file  . Highest education level: Not on file  Occupational History  . Not on file  Tobacco Use  . Smoking status: Never  . Smokeless tobacco: Never  Substance and Sexual Activity  . Alcohol use: Yes  . Drug use: Not on file  . Sexual activity: Not on file  Other Topics Concern  . Not on file  Social History Narrative  . Not on file   Social Determinants of Health   Financial Resource Strain: Not on file  Food Insecurity: Not on file  Transportation Needs: Not on file  Physical Activity: Not on file  Stress: Not on file  Social  Connections: Not on file  Intimate Partner Violence: Not on file    Allergies:  Allergies  Allergen Reactions  . Grass Extracts [Gramineae Pollens]     Medications: Prior to Admission medications   Medication Sig Start Date End Date Taking? Authorizing Provider  albuterol (PROVENTIL HFA;VENTOLIN HFA) 108 (90 Base) MCG/ACT inhaler 2 puffs every 4-6 hours prn wheezing, cough 12/15/16   Payton Mccallum, MD  albuterol (PROVENTIL) (2.5 MG/3ML) 0.083% nebulizer solution Take 3 mLs (2.5 mg total) by nebulization every 4 (four) hours as needed for wheezing or shortness of breath. 10/18/18   Ihor Austin, MD  albuterol (PROVENTIL) (2.5 MG/3ML) 0.083% nebulizer solution Take 3 mLs (2.5 mg total) by nebulization every 6 (six) hours as needed for wheezing or shortness of breath. 11/07/18   Loleta Rose, MD  fluticasone (FLONASE) 50 MCG/ACT nasal spray Place 2 sprays into both nostrils daily. 04/02/21   [provider]  montelukast (SINGULAIR) 10 MG tablet Take 1 tablet (10 mg total) by mouth at bedtime. 11/07/18 11/07/19  Loleta Rose, MD  Prenatal Vit-Fe Fumarate-FA (M-NATAL PLUS) 27-1 MG TABS Take 1 tablet by mouth daily. 06/05/21   [provider]    Physical Exam Vitals: Blood pressure 112/60, pulse 90, weight 182 lb (82.6 kg), last menstrual period 04/22/2021.  General: NAD HEENT: normocephalic, anicteric Thyroid: no enlargement, no palpable nodules Pulmonary: No increased work of breathing, CTAB Cardiovascular: RRR, distal pulses 2+ Abdomen: NABS, soft, non-tender, non-distended.  Umbilicus without lesions.  No hepatomegaly, splenomegaly or masses palpable. No evidence of hernia  Genitourinary:  External: Normal external female genitalia.  Normal urethral meatus, normal  Bartholin's and Skene's glands.    Vagina: Normal vaginal mucosa, no evidence of prolapse.  Smaller vaginal dimension.  Cervix: Grossly normal in appearance, no bleeding  Uterus: anteverted Non-enlarged,  mobile, normal contour.  No CMT  Adnexa: ovaries non-enlarged, no adnexal masses  Rectal: deferred Extremities: no edema, erythema, or tenderness Neurologic: Grossly intact Psychiatric: mood appropriate, affect full   Assessment: 35 y.o. G1P0 at [redacted]w[redacted]d presenting to initiate prenatal care  Plan: 1) Avoid alcoholic beverages. 2) Patient encouraged not to smoke.  3) Discontinue the use of all non-medicinal drugs and chemicals.  4) Take prenatal vitamins daily.  5) Nutrition, food safety (fish, cheese advisories, and high nitrite foods) and exercise discussed. 6) Hospital and practice style discussed with cross coverage system.  7) Genetic Screening, such as with 1st Trimester Screening, cell free fetal DNA, AFP testing, and Ultrasound, as well as with amniocentesis and CVS as appropriate, is discussed with patient. At the conclusion of today's visit patient requested genetic testing.she is interested in the Inheritest and the MaternT testing. 8) Patient is asked about travel to areas at risk for the Zika virus, and counseled to  avoid travel and exposure to mosquitoes or sexual partners who may have themselves been exposed to the virus. Testing is discussed, and will be ordered as appropriate.   Breastfeeding discussed and encouraged.  The following were addressed during this visit:  Breastfeeding Education - Early initiation of breastfeeding    Comments: Keeps milk supply adequate, helps contract uterus and slow bleeding, and early milk is the perfect first food and is easy to digest.   - Risks of giving your baby anything other than breast milk if you are breastfeeding    Comments: Make the baby less content with breastfeeds, may make my baby more susceptible to illness, and may reduce my milk supply.   - Feeding on demand or baby-led feeding    Comments: Helps prevent breastfeeding complications, helps bring in good milk supply, prevents under or overfeeding, and helps baby feel  content and satisfied   - Frequent feeding to help assure optimal milk production    Comments: Making a full supply of milk requires frequent removal of milk from breasts, infant will eat 8-12 times in 24 hours, if separated from infant use breast massage, hand expression and/ or pumping to remove milk from breasts.   - Effective positioning and attachment    Comments: Helps my baby to get enough breast milk, helps to produce an adequate milk supply, and helps prevent nipple pain and damage   - Exclusive breastfeeding for the first 6 months    Comments: Builds a healthy milk supply and keeps it up, protects baby from sickness and disease, and breastmilk has everything your baby needs for the first 6 months.  - Individualized Education    Comments: Contraindications to breastfeeding and other special medical conditions    RTC in 3 wks for dating scan and bloodwork. She will have the Inheritest and Maternt testing Mirna Mires, CNM  06/17/2021 2:40 PM

## 2021-06-19 LAB — CYTOLOGY - PAP
Chlamydia: NEGATIVE
Comment: NEGATIVE
Comment: NEGATIVE
Comment: NEGATIVE
Comment: NORMAL
Diagnosis: NEGATIVE
High risk HPV: NEGATIVE
Neisseria Gonorrhea: NEGATIVE
Trichomonas: NEGATIVE

## 2021-06-20 LAB — URINE CULTURE

## 2021-06-21 ENCOUNTER — Other Ambulatory Visit: Payer: Self-pay | Admitting: Obstetrics & Gynecology

## 2021-06-21 MED ORDER — AMOXICILLIN-POT CLAVULANATE 875-125 MG PO TABS: 1.0000 | ORAL_TABLET | Freq: Two times a day (BID) | ORAL | 0 refills | Status: AC

## 2021-06-24 ENCOUNTER — Other Ambulatory Visit: Payer: Self-pay | Admitting: Obstetrics

## 2021-06-24 DIAGNOSIS — O2340 Unspecified infection of urinary tract in pregnancy, unspecified trimester: Secondary | ICD-10-CM

## 2021-06-24 MED ORDER — SULFAMETHOXAZOLE-TRIMETHOPRIM 800-160 MG PO TABS: 1.0000 | ORAL_TABLET | Freq: Two times a day (BID) | ORAL | 1 refills | Status: DC

## 2021-06-24 NOTE — Progress Notes (Signed)
Patient called.  Patient aware. I have prescribed her Bactrim for the UTI, and she plans to go pick it up and get started theophylline sensitivity test showed resistance to macrobid. Mirna Mires, CNM  06/24/2021 4:36 PM

## 2021-07-08 ENCOUNTER — Other Ambulatory Visit: Payer: Self-pay

## 2021-07-08 ENCOUNTER — Encounter: Payer: Self-pay | Admitting: Obstetrics & Gynecology

## 2021-07-08 ENCOUNTER — Ambulatory Visit (INDEPENDENT_AMBULATORY_CARE_PROVIDER_SITE_OTHER): Payer: BC Managed Care – PPO | Admitting: Obstetrics & Gynecology

## 2021-07-08 VITALS — BP 110/70 | Wt 179.0 lb

## 2021-07-08 DIAGNOSIS — Z3A11 11 weeks gestation of pregnancy: Secondary | ICD-10-CM

## 2021-07-08 DIAGNOSIS — O0991 Supervision of high risk pregnancy, unspecified, first trimester: Secondary | ICD-10-CM

## 2021-07-08 DIAGNOSIS — Z1379 Encounter for other screening for genetic and chromosomal anomalies: Secondary | ICD-10-CM

## 2021-07-08 DIAGNOSIS — O09511 Supervision of elderly primigravida, first trimester: Secondary | ICD-10-CM | POA: Diagnosis not present

## 2021-07-08 NOTE — Patient Instructions (Signed)
Commonly Asked Questions During Pregnancy Due Date 01/27/2022 !  Cats: A parasite can be excreted in cat feces.  To avoid exposure you need to have another person empty the little box.  If you must empty the litter box you will need to wear gloves.  Wash your hands after handling your cat.  This parasite can also be found in raw or undercooked meat so this should also be avoided.  Colds, Sore Throats, Flu: Please check your medication sheet to see what you can take for symptoms.  If your symptoms are unrelieved by these medications please call the office.  Dental Work: Most any dental work Agricultural consultant recommends is permitted.  X-rays should only be taken during the first trimester if absolutely necessary.  Your abdomen should be shielded with a lead apron during all x-rays.  Please notify your provider prior to receiving any x-rays.  Novocaine is fine; gas is not recommended.  If your dentist requires a note from [place] prior to dental work please call the office and we will provide one for you.  Exercise: Exercise is an important part of staying healthy during your pregnancy.  You may continue most exercises you were accustomed to prior to pregnancy.  Later in your pregnancy you will most likely notice you have difficulty with activities requiring balance like riding a bicycle.  It is important that you listen to your body and avoid activities that put you at a higher risk of falling.  Adequate rest and staying well hydrated are a must!  If you have questions about the safety of specific activities ask your provider.    Exposure to Children with illness: Try to avoid obvious exposure; report any symptoms to [place] when noted,  If you have chicken pos, red measles or mumps, you should be immune to these diseases.   Please do not take any vaccines while pregnant unless you have checked with your OB provider.  Fetal Movement: After 28 weeks we recommend you do "kick counts" twice daily.  Lie or sit down in a calm  quiet environment and count your baby movements "kicks".  You should feel your baby at least 10 times per hour.  If you have not felt 10 kicks within the first hour get up, walk around and have something sweet to eat or drink then repeat for an additional hour.  If count remains less than 10 per hour notify your provider.  Fumigating: Follow your pest control agent's advice as to how long to stay out of your home.  Ventilate the area well before re-entering.  Hemorrhoids:   Most over-the-counter preparations can be used during pregnancy.  Check your medication to see what is safe to use.  It is important to use a stool softener or fiber in your diet and to drink lots of liquids.  If hemorrhoids seem to be getting worse please call the office.   Hot Tubs:  Hot tubs Jacuzzis and saunas are not recommended while pregnant.  These increase your internal body temperature and should be avoided.  Intercourse:  Sexual intercourse is safe during pregnancy as long as you are comfortable, unless otherwise advised by your provider.  Spotting may occur after intercourse; report any bright red bleeding that is heavier than spotting.  Labor:  If you know that you are in labor, please go to the hospital.  If you are unsure, please call the office and let us help you decide what to do.  Lifting, straining, etc:  If  your job requires heavy lifting or straining please check with your provider for any limitations.  Generally, you should not lift items heavier than that you can lift simply with your hands and arms (no back muscles)  Painting:  Paint fumes do not harm your pregnancy, but may make you ill and should be avoided if possible.  Latex or water based paints have less odor than oils.  Use adequate ventilation while painting.  Permanents & Hair Color:  Chemicals in hair dyes are not recommended as they cause increase hair dryness which can increase hair loss during pregnancy.  " Highlighting" and permanents are  allowed.  Dye may be absorbed differently and permanents may not hold as well during pregnancy.  Sunbathing:  Use a sunscreen, as skin burns easily during pregnancy.  Drink plenty of fluids; avoid over heating.  Tanning Beds:  Because their possible side effects are still unknown, tanning beds are not recommended.  Ultrasound Scans:  Routine ultrasounds are performed at approximately 20 weeks.  You will be able to see your baby's general anatomy an if you would like to know the gender this can usually be determined as well.  If it is questionable when you conceived you may also receive an ultrasound early in your pregnancy for dating purposes.  Otherwise ultrasound exams are not routinely performed unless there is a medical necessity.  Although you can request a scan we ask that you pay for it when conducted because insurance does not cover " patient request" scans.  Work: If your pregnancy proceeds without complications you may work until your due date, unless your physician or employer advises otherwise.  Round Ligament Pain/Pelvic Discomfort:  Sharp, shooting pains not associated with bleeding are fairly common, usually occurring in the second trimester of pregnancy.  They tend to be worse when standing up or when you remain standing for long periods of time.  These are the result of pressure of certain pelvic ligaments called "round ligaments".  Rest, Tylenol and heat seem to be the most effective relief.  As the womb and fetus grow, they rise out of the pelvis and the discomfort improves.  Please notify the office if your pain seems different than that described.  It may represent a more serious condition.

## 2021-07-08 NOTE — Progress Notes (Signed)
  Subjective  No nausea or pain or bleeding  Objective  BP 110/70   Wt 179 lb (81.2 kg)   LMP 04/22/2021 (Exact Date)   BMI 32.74 kg/m  General: NAD Pumonary: no increased work of breathing Abdomen: gravid, non-tender Extremities: no edema Psychiatric: mood appropriate, affect full  Assessment  35 y.o. G1P0 at [redacted]w[redacted]d by  01/27/2022, by Last Menstrual Period presenting for routine prenatal visit  Plan   Problem List Items Addressed This Visit      Other   Supervision of high risk pregnancy, antepartum - Primary   Primigravida of advanced maternal age in first trimester   Relevant Orders   [place] MFM OB DETAIL +14 WK   RPR+Rh+ABO+Rub Ab+Ab Scr+CB...   Hepatitis C antibody  Other Visit Diagnoses    [redacted] weeks gestation of pregnancy       Encounter for genetic screening for Down Syndrome       Relevant Orders   MaterniT21 PLUS Core+SCA    See [place] report today Labs today NIPT today  Annamarie Major, MD, [facility], [facility] 07/08/2021  11:01 AM

## 2021-07-08 NOTE — Progress Notes (Signed)
ULTRASOUND REPORT  Location: [facility] Date of Service: 07/08/2021   Indications:dating Findings:  Mason Jim intrauterine pregnancy is visualized with a CRL consistent with [redacted]w[redacted]d gestation, giving an (U/S) EDD of 01/28/22. The (U/S) EDD is consistent with the clinically established EDD of 01/27/22.  FHR: 150 BPM CRL measurement: 38.7 mm Yolk sac is not visualized. Amnion: visualized and appears normal   Right Ovary is normal in appearance. Left Ovary is normal appearance. Corpus luteal cyst:  is not visualized Survey of the adnexa demonstrates no adnexal masses. There is no free peritoneal fluid in the cul de sac.  Impression: 1. [redacted]w[redacted]d Viable Singleton Intrauterine pregnancy by U/S. 2. (U/S) EDD is consistent with Clinically established EDD of 01/27/2022.  Recommendations: 1.Clinical correlation with the patient's History and Physical Exam. 2. Keep EDC 3. Anat scan MFM 19-20 weeks  Letitia Libra, MD

## 2021-07-09 ENCOUNTER — Other Ambulatory Visit: Payer: Self-pay | Admitting: Obstetrics & Gynecology

## 2021-07-09 LAB — OB RESULTS CONSOLE VARICELLA ZOSTER ANTIBODY, IGG: Varicella: IMMUNE

## 2021-07-14 LAB — MATERNIT21 PLUS CORE+SCA
Fetal Fraction: 6
Monosomy X (Turner Syndrome): NOT DETECTED
Result (T21): NEGATIVE
Trisomy 13 (Patau syndrome): NEGATIVE
Trisomy 18 (Edwards syndrome): NEGATIVE
Trisomy 21 (Down syndrome): NEGATIVE
XXX (Triple X Syndrome): NOT DETECTED
XXY (Klinefelter Syndrome): NOT DETECTED
XYY (Jacobs Syndrome): NOT DETECTED

## 2021-07-14 LAB — RPR+RH+ABO+RUB AB+AB SCR+CB...
Antibody Screen: NEGATIVE
HIV Screen 4th Generation wRfx: NONREACTIVE
Hematocrit: 38.7 % (ref 34.0–46.6)
Hemoglobin: 13.3 g/dL (ref 11.1–15.9)
Hepatitis B Surface Ag: NEGATIVE
MCH: 32 pg (ref 26.6–33.0)
MCHC: 34.4 g/dL (ref 31.5–35.7)
MCV: 93 fL (ref 79–97)
Platelets: 409 10*3/uL (ref 150–450)
RBC: 4.15 x10E6/uL (ref 3.77–5.28)
RDW: 12.4 % (ref 11.7–15.4)
RPR Ser Ql: NONREACTIVE
Rh Factor: POSITIVE
Rubella Antibodies, IGG: 7.86 index (ref >0.99)
Varicella zoster IgG: 1297 index (ref >165)
WBC: 8 10*3/uL (ref 3.4–10.8)

## 2021-07-14 LAB — HEPATITIS C ANTIBODY: Hep C Virus Ab: <0.1 s/co ratio (ref 0.0–0.9)

## 2021-07-15 ENCOUNTER — Other Ambulatory Visit: Payer: Self-pay | Admitting: Obstetrics & Gynecology

## 2021-07-15 DIAGNOSIS — O0990 Supervision of high risk pregnancy, unspecified, unspecified trimester: Secondary | ICD-10-CM

## 2021-08-08 ENCOUNTER — Encounter: Payer: BC Managed Care – PPO | Admitting: Obstetrics and Gynecology

## 2021-08-09 ENCOUNTER — Ambulatory Visit (INDEPENDENT_AMBULATORY_CARE_PROVIDER_SITE_OTHER): Payer: BC Managed Care – PPO | Admitting: Obstetrics and Gynecology

## 2021-08-09 ENCOUNTER — Other Ambulatory Visit: Payer: Self-pay

## 2021-08-09 VITALS — BP 120/70 | Wt 184.0 lb

## 2021-08-09 DIAGNOSIS — O09511 Supervision of elderly primigravida, first trimester: Secondary | ICD-10-CM

## 2021-08-09 DIAGNOSIS — O0990 Supervision of high risk pregnancy, unspecified, unspecified trimester: Secondary | ICD-10-CM

## 2021-08-09 NOTE — Progress Notes (Signed)
Routine Prenatal Care Visit  Subjective  Renee Gonzalez is a 35 y.o. G1P0 at [redacted]w[redacted]d being seen today for ongoing prenatal care.  She is currently monitored for the following issues for this high-risk pregnancy and has Asthma in adult, severe persistent, with acute exacerbation; Supervision of high risk pregnancy, antepartum; and Primigravida of advanced maternal age in first trimester on their problem list.  ----------------------------------------------------------------------------------- Patient reports no complaints.   Contractions: Not present. Vag. Bleeding: None.  Movement: Absent. Denies leaking of fluid.  ----------------------------------------------------------------------------------- The following portions of the patient's history were reviewed and updated as appropriate: allergies, current medications, past family history, past medical history, past social history, past surgical history and problem list. Problem list updated.   Objective  Blood pressure 120/70, weight 184 lb (83.5 kg), last menstrual period 04/22/2021. Pregravid weight 175 lb (79.4 kg) Total Weight Gain 9 lb (4.082 kg) Urinalysis:      Fetal Status: Fetal Heart Rate (bpm): 150   Movement: Absent     General:  Alert, oriented and cooperative. Patient is in no acute distress.  Skin: Skin is warm and dry. No rash noted.   Cardiovascular: Normal heart rate noted  Respiratory: Normal respiratory effort, no problems with respiration noted  Abdomen: Soft, gravid, appropriate for gestational age. Pain/Pressure: Absent     Pelvic:  Cervical exam deferred        Extremities: Normal range of motion.  Edema: None  Mental Status: Normal mood and affect. Normal behavior. Normal judgment and thought content.     Assessment   35 y.o. G1P0 at [redacted]w[redacted]d by  01/27/2022, by Last Menstrual Period presenting for routine prenatal visit  Plan   pregnancy 1 Problems (from 06/17/21 to present)     Problem Noted Resolved    Supervision of high risk pregnancy, antepartum 06/17/2021 by Mirna Mires, CNM No   Overview Addendum 08/09/2021  3:49 PM by Natale Milch, MD     Nursing Staff Provider  Office Location  [facility] Dating  LMP= 10 wk [place]  Language  English or Spanish Anatomy US    Flu Vaccine   Genetic Screen  MaternT: nml XX  TDaP vaccine    Hgb A1C or  GTT Third trimester :   Covid vaccinated   LAB RESULTS   Rhogam  n/a Blood Type O/Positive/-- (08/09 1532)   Feeding Plan  Antibody Negative (08/09 1532)  Contraception  Rubella 7.86 (08/09 1532)  Circumcision  RPR Non Reactive (08/09 1532)   Pediatrician   HBsAg Negative (08/09 1532)   Support Person Husband HIV Non Reactive (08/09 1532)  Prenatal Classes  Varicella  immune    GBS  (For PCN allergy, check sensitivities)   BTL Consent Not needed    VBAC Consent n/a Pap  7/22 nml    Hgb Electro      CF      SMA        Advanced maternal age in pregnancy.       Primigravida of advanced maternal age in first trimester 06/17/2021 by Mirna Mires, CNM No       Recommended initiation of 81 mg ASA Discussed covid precautions with return to school  Gestational age appropriate obstetric precautions including but not limited to vaginal bleeding, contractions, leaking of fluid and fetal movement were reviewed in detail with the patient.    Return in about 4 weeks (around 09/06/2021) for ROB after anatomy scan.  Natale Milch MD [facility], [facility] 08/09/2021, 3:49 PM

## 2021-08-09 NOTE — Patient Instructions (Signed)

## 2021-08-29 ENCOUNTER — Ambulatory Visit: Payer: BC Managed Care – PPO | Attending: Maternal & Fetal Medicine

## 2021-08-29 ENCOUNTER — Other Ambulatory Visit: Payer: Self-pay

## 2021-08-29 VITALS — BP 125/79 | HR 69 | Temp 97.7°F | Ht 63.0 in | Wt 183.0 lb

## 2021-08-29 DIAGNOSIS — O09512 Supervision of elderly primigravida, second trimester: Secondary | ICD-10-CM | POA: Diagnosis not present

## 2021-08-29 DIAGNOSIS — Z363 Encounter for antenatal screening for malformations: Secondary | ICD-10-CM | POA: Diagnosis not present

## 2021-08-29 DIAGNOSIS — O0990 Supervision of high risk pregnancy, unspecified, unspecified trimester: Secondary | ICD-10-CM

## 2021-08-29 DIAGNOSIS — O99212 Obesity complicating pregnancy, second trimester: Secondary | ICD-10-CM | POA: Insufficient documentation

## 2021-08-29 DIAGNOSIS — E669 Obesity, unspecified: Secondary | ICD-10-CM | POA: Diagnosis not present

## 2021-08-29 DIAGNOSIS — Z3689 Encounter for other specified antenatal screening: Secondary | ICD-10-CM | POA: Diagnosis not present

## 2021-08-29 DIAGNOSIS — O09511 Supervision of elderly primigravida, first trimester: Secondary | ICD-10-CM

## 2021-08-29 DIAGNOSIS — Z3A18 18 weeks gestation of pregnancy: Secondary | ICD-10-CM | POA: Diagnosis not present

## 2021-08-29 DIAGNOSIS — O0992 Supervision of high risk pregnancy, unspecified, second trimester: Secondary | ICD-10-CM

## 2021-08-29 DIAGNOSIS — O0991 Supervision of high risk pregnancy, unspecified, first trimester: Secondary | ICD-10-CM

## 2021-09-03 ENCOUNTER — Ambulatory Visit (INDEPENDENT_AMBULATORY_CARE_PROVIDER_SITE_OTHER): Payer: BC Managed Care – PPO | Admitting: Obstetrics and Gynecology

## 2021-09-03 ENCOUNTER — Encounter: Payer: Self-pay | Admitting: Obstetrics and Gynecology

## 2021-09-03 ENCOUNTER — Other Ambulatory Visit: Payer: Self-pay

## 2021-09-03 VITALS — BP 116/70 | Ht 63.0 in | Wt 186.0 lb

## 2021-09-03 DIAGNOSIS — O0990 Supervision of high risk pregnancy, unspecified, unspecified trimester: Secondary | ICD-10-CM

## 2021-09-03 DIAGNOSIS — Z3A19 19 weeks gestation of pregnancy: Secondary | ICD-10-CM

## 2021-09-03 LAB — POCT URINALYSIS DIPSTICK OB
Glucose, UA: NEGATIVE
POC,PROTEIN,UA: NEGATIVE

## 2021-09-03 NOTE — Progress Notes (Signed)
Routine Prenatal Care Visit  Subjective  Renee Gonzalez is a 35 y.o. G1P0 at [redacted]w[redacted]d being seen today for ongoing prenatal care.  She is currently monitored for the following issues for this high-risk pregnancy and has Asthma in adult, severe persistent, with acute exacerbation; Supervision of high risk pregnancy, antepartum; and Primigravida of advanced maternal age in first trimester on their problem list.  ----------------------------------------------------------------------------------- Patient reports no complaints.   Contractions: Not present. Vag. Bleeding: None.  Movement: Present. Denies leaking of fluid.  ----------------------------------------------------------------------------------- The following portions of the patient's history were reviewed and updated as appropriate: allergies, current medications, past family history, past medical history, past social history, past surgical history and problem list. Problem list updated.   Objective  Blood pressure 116/70, height 5\' 3"  (1.6 m), weight 186 lb (84.4 kg), last menstrual period 04/22/2021. Pregravid weight 175 lb (79.4 kg) Total Weight Gain 11 lb (4.99 kg) Urinalysis:      Fetal Status:     Movement: Present     General:  Alert, oriented and cooperative. Patient is in no acute distress.  Skin: Skin is warm and dry. No rash noted.   Cardiovascular: Normal heart rate noted  Respiratory: Normal respiratory effort, no problems with respiration noted  Abdomen: Soft, gravid, appropriate for gestational age. Pain/Pressure: Absent     Pelvic:  Cervical exam deferred        Extremities: Normal range of motion.     Mental Status: Normal mood and affect. Normal behavior. Normal judgment and thought content.     Assessment   35 y.o. G1P0 at [redacted]w[redacted]d by  01/27/2022, by Last Menstrual Period presenting for routine prenatal visit  Plan   pregnancy 1 Problems (from 06/17/21 to present)     Problem Noted Resolved   Supervision of  high risk pregnancy, antepartum 06/17/2021 by 06/19/2021, CNM No   Overview Addendum 09/03/2021  3:41 PM by 11/03/2021, MD     Nursing Staff Provider  Office Location  [facility] Dating  LMP= 10 wk Natale Milch  Language  English or Spanish Anatomy US  Complete, low ying placenta  Flu Vaccine   Genetic Screen  MaternT: nml XX  TDaP vaccine    Hgb A1C or  GTT Third trimester :   Covid Vaccinated Had in 2021   LAB RESULTS   Rhogam  n/a Blood Type O/Positive/-- (08/09 1532)   Feeding Plan BReast Antibody Negative (08/09 1532)  Contraception  Rubella 7.86 (08/09 1532)  Circumcision  RPR Non Reactive (08/09 1532)   Pediatrician   HBsAg Negative (08/09 1532)   Support Person Husband HIV Non Reactive (08/09 1532)  Prenatal Classes Discussed Varicella  immune    GBS  (For PCN allergy, check sensitivities)   BTL Consent Not needed    VBAC Consent n/a Pap  7/22 nml    Hgb Electro      CF      SMA        Advanced maternal age in pregnancy.       Primigravida of advanced maternal age in first trimester 06/17/2021 by 06/19/2021, CNM No       Discussed prenatal classes Anatomy Mirna Mires WNL- follow up for spine and placenta location planned for November.  Gestational age appropriate obstetric precautions including but not limited to vaginal bleeding, contractions, leaking of fluid and fetal movement were reviewed in detail with the patient.    Return in about 4 weeks (around 10/01/2021) for ROB in person.  Natale Milch MD [facility], [facility] 09/03/2021, 3:40 PM

## 2021-10-01 ENCOUNTER — Encounter: Payer: Self-pay | Admitting: Obstetrics and Gynecology

## 2021-10-01 ENCOUNTER — Ambulatory Visit (INDEPENDENT_AMBULATORY_CARE_PROVIDER_SITE_OTHER): Payer: BC Managed Care – PPO | Admitting: Obstetrics and Gynecology

## 2021-10-01 ENCOUNTER — Other Ambulatory Visit: Payer: Self-pay

## 2021-10-01 VITALS — BP 120/70 | Ht 63.0 in | Wt 191.6 lb

## 2021-10-01 DIAGNOSIS — O0990 Supervision of high risk pregnancy, unspecified, unspecified trimester: Secondary | ICD-10-CM

## 2021-10-01 DIAGNOSIS — Z3A23 23 weeks gestation of pregnancy: Secondary | ICD-10-CM

## 2021-10-01 LAB — POCT URINALYSIS DIPSTICK OB
Glucose, UA: NEGATIVE
POC,PROTEIN,UA: NEGATIVE

## 2021-10-01 NOTE — Progress Notes (Signed)
Routine Prenatal Care Visit  Subjective  Renee Gonzalez is a 35 y.o. G1P0 at [redacted]w[redacted]d being seen today for ongoing prenatal care.  She is currently monitored for the following issues for this high-risk pregnancy and has Asthma in adult, severe persistent, with acute exacerbation; Supervision of high risk pregnancy, antepartum; and Primigravida of advanced maternal age in first trimester on their problem list.  ----------------------------------------------------------------------------------- Patient reports no complaints.   Contractions: Not present. Vag. Bleeding: None.  Movement: Present. Denies leaking of fluid.  ----------------------------------------------------------------------------------- The following portions of the patient's history were reviewed and updated as appropriate: allergies, current medications, past family history, past medical history, past social history, past surgical history and problem list. Problem list updated.   Objective  Blood pressure 120/70, height 5\' 3"  (1.6 m), weight 191 lb 9.6 oz (86.9 kg), last menstrual period 04/22/2021. Pregravid weight 175 lb (79.4 kg) Total Weight Gain 16 lb 9.6 oz (7.53 kg) Urinalysis:      Fetal Status:     Movement: Present     General:  Alert, oriented and cooperative. Patient is in no acute distress.  Skin: Skin is warm and dry. No rash noted.   Cardiovascular: Normal heart rate noted  Respiratory: Normal respiratory effort, no problems with respiration noted  Abdomen: Soft, gravid, appropriate for gestational age. Pain/Pressure: Absent     Pelvic:  Cervical exam deferred        Extremities: Normal range of motion.     Mental Status: Normal mood and affect. Normal behavior. Normal judgment and thought content.     Assessment   35 y.o. G1P0 at [redacted]w[redacted]d by  01/27/2022, by Last Menstrual Period presenting for routine prenatal visit  Plan   pregnancy 1 Problems (from 06/17/21 to present)     Problem Noted Resolved    Supervision of high risk pregnancy, antepartum 06/17/2021 by 06/19/2021, CNM No   Overview Addendum 09/03/2021  3:41 PM by 11/03/2021, MD     Nursing Staff Provider  Office Location  [facility] Dating  LMP= 10 wk Natale Milch  Language  English or Spanish Anatomy US  Complete, low ying placenta  Flu Vaccine   Genetic Screen  MaternT: nml XX  TDaP vaccine    Hgb A1C or  GTT Third trimester :   Covid Vaccinated Had in 2021   LAB RESULTS   Rhogam  n/a Blood Type O/Positive/-- (08/09 1532)   Feeding Plan BReast Antibody Negative (08/09 1532)  Contraception  Rubella 7.86 (08/09 1532)  Circumcision  RPR Non Reactive (08/09 1532)   Pediatrician   HBsAg Negative (08/09 1532)   Support Person Husband HIV Non Reactive (08/09 1532)  Prenatal Classes Discussed Varicella  immune    GBS  (For PCN allergy, check sensitivities)   BTL Consent Not needed    VBAC Consent n/a Pap  7/22 nml    Hgb Electro      CF      SMA        Advanced maternal age in pregnancy.       Primigravida of advanced maternal age in first trimester 06/17/2021 by 06/19/2021, CNM No        Gestational age appropriate obstetric precautions including but not limited to vaginal bleeding, contractions, leaking of fluid and fetal movement were reviewed in detail with the patient.    Return in about 4 weeks (around 10/29/2021) for ROB and 1 GTT.  10/31/2021 MD [facility], [facility]  10/01/2021, 4:55 PM

## 2021-10-01 NOTE — Patient Instructions (Signed)

## 2021-10-08 ENCOUNTER — Other Ambulatory Visit: Payer: Self-pay

## 2021-10-08 ENCOUNTER — Ambulatory Visit: Payer: BC Managed Care – PPO | Attending: Obstetrics and Gynecology

## 2021-10-08 VITALS — BP 123/65 | HR 91 | Temp 97.4°F | Ht 63.0 in | Wt 194.0 lb

## 2021-10-08 DIAGNOSIS — J45909 Unspecified asthma, uncomplicated: Secondary | ICD-10-CM | POA: Diagnosis not present

## 2021-10-08 DIAGNOSIS — O09512 Supervision of elderly primigravida, second trimester: Secondary | ICD-10-CM

## 2021-10-08 DIAGNOSIS — Z3A24 24 weeks gestation of pregnancy: Secondary | ICD-10-CM | POA: Diagnosis not present

## 2021-10-08 DIAGNOSIS — O09522 Supervision of elderly multigravida, second trimester: Secondary | ICD-10-CM | POA: Insufficient documentation

## 2021-10-08 DIAGNOSIS — O99512 Diseases of the respiratory system complicating pregnancy, second trimester: Secondary | ICD-10-CM | POA: Insufficient documentation

## 2021-10-08 DIAGNOSIS — O0990 Supervision of high risk pregnancy, unspecified, unspecified trimester: Secondary | ICD-10-CM

## 2021-10-08 DIAGNOSIS — O09511 Supervision of elderly primigravida, first trimester: Secondary | ICD-10-CM

## 2021-10-10 ENCOUNTER — Ambulatory Visit: Payer: BC Managed Care – PPO

## 2021-10-29 ENCOUNTER — Encounter: Payer: Self-pay | Admitting: Obstetrics & Gynecology

## 2021-10-29 ENCOUNTER — Other Ambulatory Visit: Payer: BC Managed Care – PPO

## 2021-10-29 ENCOUNTER — Other Ambulatory Visit: Payer: Self-pay

## 2021-10-29 ENCOUNTER — Ambulatory Visit (INDEPENDENT_AMBULATORY_CARE_PROVIDER_SITE_OTHER): Payer: BC Managed Care – PPO | Admitting: Obstetrics & Gynecology

## 2021-10-29 VITALS — BP 120/80 | Wt 198.0 lb

## 2021-10-29 DIAGNOSIS — Z23 Encounter for immunization: Secondary | ICD-10-CM | POA: Diagnosis not present

## 2021-10-29 DIAGNOSIS — O09513 Supervision of elderly primigravida, third trimester: Secondary | ICD-10-CM

## 2021-10-29 DIAGNOSIS — Z3A27 27 weeks gestation of pregnancy: Secondary | ICD-10-CM

## 2021-10-29 DIAGNOSIS — O0993 Supervision of high risk pregnancy, unspecified, third trimester: Secondary | ICD-10-CM

## 2021-10-29 NOTE — Progress Notes (Signed)
  Subjective  Fetal Movement? yes Contractions? no Leaking Fluid? no Vaginal Bleeding? no  Objective  BP 120/80   Wt 198 lb (89.8 kg)   LMP 04/22/2021 (Exact Date)   BMI 35.07 kg/m  General: NAD Pumonary: no increased work of breathing Abdomen: gravid, non-tender Extremities: no edema Psychiatric: mood appropriate, affect full  Assessment  35 y.o. G1P0 at [redacted]w[redacted]d by  01/27/2022, by Last Menstrual Period presenting for routine prenatal visit  Plan   Problem List Items Addressed This Visit      Other   Supervision of high risk pregnancy, antepartum - Primary   Primigravida of advanced maternal age in first trimester  Other Visit Diagnoses    [redacted] weeks gestation of pregnancy        Glucola today PNV, [facility] PTL precautions Plans to breast feed.  Plans POP for contraception Flu shot today  pregnancy 1 Problems (from 06/17/21 to present)    Problem Noted Resolved   Supervision of high risk pregnancy, antepartum 06/17/2021 by Mirna Mires, CNM No   Overview Addendum 10/29/2021  3:27 PM by Nadara Mustard, MD     Nursing Staff Provider  Office Location  [facility] Dating  LMP= 10 wk [place]  Language  English or Spanish Anatomy US  Complete, low ying placenta  Flu Vaccine  10/29/21 Genetic Screen  MaternT: nml XX  TDaP vaccine    Hgb A1C or  GTT Third trimester :   Covid Vaccinated Had in 2021   LAB RESULTS   Rhogam  n/a Blood Type O/Positive/-- (08/09 1532)   Feeding Plan Breast Antibody Negative (08/09 1532)  Contraception POP Rubella 7.86 (08/09 1532)  Circumcision n/a RPR Non Reactive (08/09 1532)   Pediatrician   HBsAg Negative (08/09 1532)   Support Person Husband HIV Non Reactive (08/09 1532)  Prenatal Classes Discussed Varicella  immune    GBS  (For PCN allergy, check sensitivities)   BTL Consent Not needed    VBAC Consent n/a Pap  7/22 nml  Advanced maternal age in pregnancy.       Primigravida of advanced maternal age in first trimester 06/17/2021 by Mirna Mires, CNM No       Annamarie Major, MD, [facility], [facility] 10/29/2021  3:28 PM

## 2021-10-29 NOTE — Patient Instructions (Signed)

## 2021-10-30 ENCOUNTER — Encounter: Payer: Self-pay | Admitting: Obstetrics & Gynecology

## 2021-10-30 ENCOUNTER — Other Ambulatory Visit: Payer: Self-pay | Admitting: Obstetrics & Gynecology

## 2021-10-30 DIAGNOSIS — O24410 Gestational diabetes mellitus in pregnancy, diet controlled: Secondary | ICD-10-CM | POA: Insufficient documentation

## 2021-10-30 LAB — 28 WEEK RH+PANEL
Basophils Absolute: 0 10*3/uL (ref 0.0–0.2)
Basos: 0 %
EOS (ABSOLUTE): 0.1 10*3/uL (ref 0.0–0.4)
Eos: 1 %
Gestational Diabetes Screen: 214 mg/dL — ABNORMAL HIGH (ref 70–139)
HIV Screen 4th Generation wRfx: NONREACTIVE
Hematocrit: 35.3 % (ref 34.0–46.6)
Hemoglobin: 11.8 g/dL (ref 11.1–15.9)
Immature Grans (Abs): 0.1 10*3/uL (ref 0.0–0.1)
Immature Granulocytes: 1 %
Lymphocytes Absolute: 1.3 10*3/uL (ref 0.7–3.1)
Lymphs: 15 %
MCH: 31.6 pg (ref 26.6–33.0)
MCHC: 33.4 g/dL (ref 31.5–35.7)
MCV: 94 fL (ref 79–97)
Monocytes Absolute: 0.6 10*3/uL (ref 0.1–0.9)
Monocytes: 7 %
Neutrophils Absolute: 6.4 10*3/uL (ref 1.4–7.0)
Neutrophils: 76 %
Platelets: 353 10*3/uL (ref 150–450)
RBC: 3.74 x10E6/uL — ABNORMAL LOW (ref 3.77–5.28)
RDW: 13 % (ref 11.7–15.4)
RPR Ser Ql: NONREACTIVE
WBC: 8.5 10*3/uL (ref 3.4–10.8)

## 2021-10-30 MED ORDER — ACCU-CHEK NANO SMARTVIEW W/DEVICE KIT: 1.0000 | PACK | 0 refills | Status: DC

## 2021-10-30 MED ORDER — ACCU-CHEK SMARTVIEW VI STRP: ORAL_STRIP | 12 refills | Status: DC

## 2021-10-30 MED ORDER — ACCU-CHEK SOFTCLIX LANCETS MISC: 12 refills | Status: DC

## 2021-10-30 NOTE — Progress Notes (Signed)
Your initial screening glucose test is elevated.  Will plan referral for lifestyles, and home BS monitoring and diet control.

## 2021-10-31 ENCOUNTER — Encounter: Payer: Self-pay | Admitting: Obstetrics and Gynecology

## 2021-10-31 ENCOUNTER — Ambulatory Visit (INDEPENDENT_AMBULATORY_CARE_PROVIDER_SITE_OTHER): Payer: BC Managed Care – PPO | Admitting: Obstetrics and Gynecology

## 2021-10-31 ENCOUNTER — Other Ambulatory Visit: Payer: Self-pay

## 2021-10-31 VITALS — BP 118/70 | Ht 63.0 in | Wt 196.0 lb

## 2021-10-31 DIAGNOSIS — O24410 Gestational diabetes mellitus in pregnancy, diet controlled: Secondary | ICD-10-CM | POA: Diagnosis not present

## 2021-10-31 DIAGNOSIS — O0990 Supervision of high risk pregnancy, unspecified, unspecified trimester: Secondary | ICD-10-CM

## 2021-10-31 DIAGNOSIS — Z3A27 27 weeks gestation of pregnancy: Secondary | ICD-10-CM

## 2021-10-31 DIAGNOSIS — O09512 Supervision of elderly primigravida, second trimester: Secondary | ICD-10-CM

## 2021-10-31 DIAGNOSIS — O99512 Diseases of the respiratory system complicating pregnancy, second trimester: Secondary | ICD-10-CM

## 2021-10-31 DIAGNOSIS — J4551 Severe persistent asthma with (acute) exacerbation: Secondary | ICD-10-CM

## 2021-10-31 LAB — GLUCOSE, POCT (MANUAL RESULT ENTRY): POC Glucose: 93 mg/dl (ref 70–99)

## 2021-10-31 LAB — POCT URINALYSIS DIPSTICK OB
Glucose, UA: NEGATIVE
POC,PROTEIN,UA: NEGATIVE

## 2021-10-31 NOTE — Progress Notes (Signed)
    Routine Prenatal Care Visit  Subjective  Renee Gonzalez is a 35 y.o. G1P0 at [redacted]w[redacted]d being seen today for ongoing prenatal care.  She is currently monitored for the following issues for this high-risk pregnancy and has Asthma in adult, severe persistent, with acute exacerbation; Supervision of high risk pregnancy, antepartum; Primigravida of advanced maternal age in first trimester; and Diet controlled gestational diabetes mellitus (GDM) in third trimester on their problem list.  ----------------------------------------------------------------------------------- Patient reports no complaints.   Contractions: Not present. Vag. Bleeding: None.  Movement: Present. Denies leaking of fluid.  ----------------------------------------------------------------------------------- The following portions of the patient's history were reviewed and updated as appropriate: allergies, current medications, past family history, past medical history, past social history, past surgical history and problem list. Problem list updated.   Objective  Blood pressure 118/70, height 5\' 3"  (1.6 m), weight 196 lb (88.9 kg), last menstrual period 04/22/2021. Pregravid weight 175 lb (79.4 kg) Total Weight Gain 21 lb (9.526 kg) Urinalysis:      Fetal Status: Fetal Heart Rate (bpm): 135   Movement: Present     General:  Alert, oriented and cooperative. Patient is in no acute distress.  Skin: Skin is warm and dry. No rash noted.   Cardiovascular: Normal heart rate noted  Respiratory: Normal respiratory effort, no problems with respiration noted  Abdomen: Soft, gravid, appropriate for gestational age. Pain/Pressure: Absent     Pelvic:  Cervical exam deferred        Extremities: Normal range of motion.  Edema: None  Mental Status: Normal mood and affect. Normal behavior. Normal judgment and thought content.     Assessment   35 y.o. G1P0 at [redacted]w[redacted]d by  01/27/2022, by Last Menstrual Period presenting for routine prenatal  visit  Plan   pregnancy 1 Problems (from 06/17/21 to present)     Problem Noted Resolved   Supervision of high risk pregnancy, antepartum 06/17/2021 by 06/19/2021, CNM No   Overview Addendum 10/31/2021  5:28 PM by 14/12/2020, MD     Nursing Staff Provider  Office Location  [facility] Dating  LMP= 10 wk Natale Milch  Language  English or Spanish Anatomy US  Complete, low ying placenta  Flu Vaccine  10/29/21 Genetic Screen  MaternT: nml XX  TDaP vaccine    Hgb A1C or  GTT Third trimester : 214  Covid Vaccinated Had in 2021   LAB RESULTS   Rhogam  n/a Blood Type O/Positive/-- (08/09 1532)   Feeding Plan Breast Antibody Negative (08/09 1532)  Contraception POP Rubella 7.86 (08/09 1532)  Circumcision n/a RPR Non Reactive (08/09 1532)   Pediatrician   HBsAg Negative (08/09 1532)   Support Person Husband HIV Non Reactive (08/09 1532)  Prenatal Classes Discussed Varicella  immune    GBS  (For PCN allergy, check sensitivities)   BTL Consent Not needed    VBAC Consent n/a Pap  7/22 nml  Advanced maternal age in pregnancy. Gestational diabetes      Primigravida of advanced maternal age in first trimester 06/17/2021 by 06/19/2021, CNM No        Gestational age appropriate obstetric precautions including but not limited to vaginal bleeding, contractions, leaking of fluid and fetal movement were reviewed in detail with the patient.    Return in about 1 week (around 11/07/2021) for ROB with MD.  14/07/2021 MD [facility], [facility] 10/31/2021, 5:29 PM

## 2021-10-31 NOTE — Patient Instructions (Signed)
Blood Glucose Monitoring, Adult Monitoring your blood sugar (glucose) is an important part of managing your diabetes. Blood glucose monitoring involves checking your blood glucose as often as directed and keeping a log or record of your results over time. Checking your blood glucose regularly and keeping a blood glucose log can: Help you and your health care provider adjust your diabetes management plan as needed, including your medicines or insulin. Help you understand how food, exercise, illnesses, and medicines affect your blood glucose. Let you know what your blood glucose is at any time. You can quickly find out if you have low blood glucose (hypoglycemia) or high blood glucose (hyperglycemia). Your health care provider will set individualized treatment goals for you. Your goals will be based on your age, other medical conditions you have, and how you respond to diabetes treatment. Generally, the goal of treatment is to maintain the following blood glucose levels: Before meals (preprandial): 80-130 mg/dL (4.4-7.2 mmol/L). After meals (postprandial): below 180 mg/dL (10 mmol/L). A1C level: less than 7%. Supplies needed: Blood glucose meter. Test strips for your meter. Each meter has its own strips. You must use the strips that came with your meter. A needle to prick your finger (lancet). Do not use a lancet more than one time. A device that holds the lancet (lancing device). A journal or log book to write down your results. How to check your blood glucose Checking your blood glucose  Wash your hands for at least 20 seconds with soap and water. Prick the side of your finger (not the tip) with the lancet. Do not use the same finger consecutively. Gently rub the finger until a small drop of blood appears. Follow instructions that come with your meter for inserting the test strip, applying blood to the strip, and using your blood glucose meter. Write down your result and any notes in your  log. Using alternative sites Some meters allow you to use areas of your body other than your finger (alternative sites) to test your blood. The most common alternative sites are the forearm, the thigh, and the palm of your hand. Alternative sites may not be as accurate as the fingers because blood flow is slower in those areas. This means that the result you get may be delayed, and it may be different from the result that you would get from your finger. Use the finger only, and do not use alternative sites, if: You think you have hypoglycemia. You sometimes do not know that your blood glucose is getting low (hypoglycemia unawareness). General tips and recommendations Blood glucose log  Every time you check your blood glucose, write down your result. Also write down any notes about things that may be affecting your blood glucose, such as your diet and exercise for the day. This information can help you and your health care provider: Look for patterns in your blood glucose over time. Adjust your diabetes management plan as needed. Check if your meter allows you to download your records to a computer or if there is an app for the meter. Most glucose meters store a record of glucose readings in the meter. If you have type 1 diabetes: Check your blood glucose 4 or more times a day if you are on intensive insulin therapy with multiple daily injections (MDI) or if you are using an insulin pump. Check your blood glucose: Before every meal and snack. Before bedtime. Also check your blood glucose: If you have symptoms of hypoglycemia. After treating low blood glucose.  Before doing activities that create a risk for injury, like driving or using machinery. Before and after exercise. Two hours after a meal. Occasionally between 2:00 a.m. and 3:00 a.m., as directed. You may need to check your blood glucose more often, 6-10 times per day, if: You have diabetes that is not well controlled. You are  ill. You have a history of severe hypoglycemia. You have hypoglycemia unawareness. If you have type 2 diabetes: Check your blood glucose 2 or more times a day if you take insulin or other diabetes medicines. Check your blood glucose 4 or more times a day if you are on intensive insulin therapy. Occasionally, you may also need to check your glucose between 2:00 a.m. and 3:00 a.m., as directed. Also check your blood glucose: Before and after exercise. Before doing activities that create a risk for injury, like driving or using machinery. You may need to check your blood glucose more often if: Your medicine is being adjusted. Your diabetes is not well controlled. You are ill. General tips Make sure you always have your supplies with you. After you use a few boxes of test strips, adjust (calibrate) your blood glucose meter by following instructions that came with your meter. If you have questions or need help, all blood glucose meters have a 24-hour hotline phone number available that you can call. Also contact your health care provider with questions or concerns you may have. Where to find more information The American Diabetes Association: www.diabetes.org The Association of Diabetes Care & Education Specialists: www.diabeteseducator.org Contact a health care provider if: Your blood glucose is at or above 240 mg/dL (16.1 mmol/L) for 2 days in a row. You have been sick or have had a fever for 2 days or longer, and you are not getting better. You have any of the following problems for more than 6 hours: You cannot eat or drink. You have nausea or vomiting. You have diarrhea. Get help right away if: Your blood glucose is lower than 54 mg/dL (3 mmol/L). You become confused, or you have trouble thinking clearly. You have difficulty breathing. You have moderate or large ketone levels in your urine. These symptoms may represent a serious problem that is an emergency. Do not wait to see if the  symptoms will go away. Get medical help right away. Call your local emergency services (911 in the [place] Do not drive yourself to the hospital. Summary Monitoring your blood glucose is an important part of managing your diabetes. Blood glucose monitoring involves checking your blood glucose as often as directed and keeping a log or record of your results over time. Your health care provider will set individualized treatment goals for you. Your goals will be based on your age, other medical conditions you have, and how you respond to diabetes treatment. Every time you check your blood glucose, write down your result. Also, write down any notes about things that may be affecting your blood glucose, such as your diet and exercise for the day. This information is not intended to replace advice given to you by your health care provider. Make sure you discuss any questions you have with your health care provider. Document Revised: 08/15/2020 Document Reviewed: 08/15/2020 Elsevier Patient Education  2022 [employer]. Diabetes Mellitus and Nutrition, Adult When you have diabetes, or diabetes mellitus, it is very important to have healthy eating habits because your blood sugar (glucose) levels are greatly affected by what you eat and drink. Eating healthy foods in the right amounts,  at about the same times every day, can help you: Manage your blood glucose. Lower your risk of heart disease. Improve your blood pressure. Reach or maintain a healthy weight. What can affect my meal plan? Every person with diabetes is different, and each person has different needs for a meal plan. Your health care provider may recommend that you work with a dietitian to make a meal plan that is best for you. Your meal plan may vary depending on factors such as: The calories you need. The medicines you take. Your weight. Your blood glucose, blood pressure, and cholesterol levels. Your activity level. Other health  conditions you have, such as heart or kidney disease. How do carbohydrates affect me? Carbohydrates, also called carbs, affect your blood glucose level more than any other type of food. Eating carbs raises the amount of glucose in your blood. It is important to know how many carbs you can safely have in each meal. This is different for every person. Your dietitian can help you calculate how many carbs you should have at each meal and for each snack. How does alcohol affect me? Alcohol can cause a decrease in blood glucose (hypoglycemia), especially if you use insulin or take certain diabetes medicines by mouth. Hypoglycemia can be a life-threatening condition. Symptoms of hypoglycemia, such as sleepiness, dizziness, and confusion, are similar to symptoms of having too much alcohol. Do not drink alcohol if: Your health care provider tells you not to drink. You are pregnant, may be pregnant, or are planning to become pregnant. If you drink alcohol: Limit how much you have to: 0-1 drink a day for women. 0-2 drinks a day for men. Know how much alcohol is in your drink. In the [place] one drink equals one 12 oz bottle of beer (355 mL), one 5 oz glass of wine (148 mL), or one 1 oz glass of hard liquor (44 mL). Keep yourself hydrated with water, diet soda, or unsweetened iced tea. Keep in mind that regular soda, juice, and other mixers may contain a lot of sugar and must be counted as carbs. What are tips for following this plan? Reading food labels Start by checking the serving size on the Nutrition Facts label of packaged foods and drinks. The number of calories and the amount of carbs, fats, and other nutrients listed on the label are based on one serving of the item. Many items contain more than one serving per package. Check the total grams (g) of carbs in one serving. Check the number of grams of saturated fats and trans fats in one serving. Choose foods that have a low amount or none of these  fats. Check the number of milligrams (mg) of salt (sodium) in one serving. Most people should limit total sodium intake to less than 2,300 mg per day. Always check the nutrition information of foods labeled as "low-fat" or "nonfat." These foods may be higher in added sugar or refined carbs and should be avoided. Talk to your dietitian to identify your daily goals for nutrients listed on the label. Shopping Avoid buying canned, pre-made, or processed foods. These foods tend to be high in fat, sodium, and added sugar. Shop around the outside edge of the grocery store. This is where you will most often find fresh fruits and vegetables, bulk grains, fresh meats, and fresh dairy products. Cooking Use low-heat cooking methods, such as baking, instead of high-heat cooking methods, such as deep frying. Cook using healthy oils, such as olive, canola, or sunflower  oil. Avoid cooking with butter, cream, or high-fat meats. Meal planning Eat meals and snacks regularly, preferably at the same times every day. Avoid going long periods of time without eating. Eat foods that are high in fiber, such as fresh fruits, vegetables, beans, and whole grains. Eat 4-6 oz (112-168 g) of lean protein each day, such as lean meat, chicken, fish, eggs, or tofu. One ounce (oz) (28 g) of lean protein is equal to: 1 oz (28 g) of meat, chicken, or fish. 1 egg.  cup (62 g) of tofu. Eat some foods each day that contain healthy fats, such as avocado, nuts, seeds, and fish. What foods should I eat? Fruits Berries. Apples. Oranges. Peaches. Apricots. Plums. Grapes. Mangoes. Papayas. Pomegranates. Kiwi. Cherries. Vegetables Leafy greens, including lettuce, spinach, kale, chard, collard greens, mustard greens, and cabbage. Beets. Cauliflower. Broccoli. Carrots. Green beans. Tomatoes. Peppers. Onions. Cucumbers. Brussels sprouts. Grains Whole grains, such as whole-wheat or whole-grain bread, crackers, tortillas, cereal, and pasta.  Unsweetened oatmeal. Quinoa. Brown or wild rice. Meats and other proteins Seafood. Poultry without skin. Lean cuts of poultry and beef. Tofu. Nuts. Seeds. Dairy Low-fat or fat-free dairy products such as milk, yogurt, and cheese. The items listed above may not be a complete list of foods and beverages you can eat and drink. Contact a dietitian for more information. What foods should I avoid? Fruits Fruits canned with syrup. Vegetables Canned vegetables. Frozen vegetables with butter or cream sauce. Grains Refined white flour and flour products such as bread, pasta, snack foods, and cereals. Avoid all processed foods. Meats and other proteins Fatty cuts of meat. Poultry with skin. Breaded or fried meats. Processed meat. Avoid saturated fats. Dairy Full-fat yogurt, cheese, or milk. Beverages Sweetened drinks, such as soda or iced tea. The items listed above may not be a complete list of foods and beverages you should avoid. Contact a dietitian for more information. Questions to ask a health care provider Do I need to meet with a certified diabetes care and education specialist? Do I need to meet with a dietitian? What number can I call if I have questions? When are the best times to check my blood glucose? Where to find more information: American Diabetes Association: diabetes.org Academy of Nutrition and Dietetics: eatright.[employer] of Diabetes and Digestive and Kidney Diseases: StageSync.si Association of Diabetes Care & Education Specialists: diabeteseducator.org Summary It is important to have healthy eating habits because your blood sugar (glucose) levels are greatly affected by what you eat and drink. It is important to use alcohol carefully. A healthy meal plan will help you manage your blood glucose and lower your risk of heart disease. Your health care provider may recommend that you work with a dietitian to make a meal plan that is best for you. This  information is not intended to replace advice given to you by your health care provider. Make sure you discuss any questions you have with your health care provider. Document Revised: 06/20/2020 Document Reviewed: 06/20/2020 Elsevier Patient Education  2022 Elsevier Inc. Gestational Diabetes Mellitus, Diagnosis Gestational diabetes mellitus, or gestational diabetes, is a form of diabetes that some women get during pregnancy. To control blood sugar (glucose) in the body, the pancreas makes a hormone called insulin. This hormone allows glucose to enter the cells in the body. The cells use glucose for energy. However, for some pregnant women, the pancreas may not make enough insulin or the body may not use available insulin properly. This leads to gestational diabetes.  Gestational diabetes lasts only a short time. It usually happens at weeks 24-28 of pregnancy and goes away after delivery. However, women who get gestational diabetes are more likely to: Get it again if they become pregnant. Develop type 2 diabetes in the future. Gestational diabetes is not likely to cause problems if it is treated. If not controlled with treatment, it may cause problems during labor and delivery. Some problems can harm the baby and the mother. What are the causes? This condition occurs during pregnancy when the woman's body makes growth hormones that help the baby grow. Sometimes, these hormones: Cause the pancreas to work harder than normal to make more insulin. Sometimes, the pancreas still cannot make enough insulin. Cause insulin resistance. This makes it hard for the cells to use insulin properly. Insulin resistance or lack of insulin causes extra glucose to build up in the blood instead of going into cells. This leads to high blood glucose (hyperglycemia). What increases the risk? This condition is more likely to develop in pregnant women who: Are older than age 25 during pregnancy. Have a family history of  diabetes. Are overweight. Have had gestational diabetes in the past. Have polycystic ovary syndrome (PCOS). Are pregnant with twins or other multiples. What are the signs or symptoms? Common symptoms of this condition include: Increased thirst. Increased hunger. Needing to urinate more often. Most women do not notice these symptoms because the symptoms are similar to other symptoms of pregnancy. How is this diagnosed? This condition may be diagnosed based on your blood glucose level. This may be checked: After you fast for 8 hours or longer (fasting blood glucose test, or FBG). Any time of day, no matter when you eat (random glucose test). At weeks 24-28 of pregnancy, to check how your body responds to glucose (oral glucose tolerance test, or OGTT). If you have risk factors, you may be screened for type 2 diabetes at your first health care visit during your pregnancy. How is this treated? Treatment for this condition depends on the stage of your pregnancy and any other medical conditions you may have. Treatment may include: Eating a healthy diet and getting more physical activity. These are the most important ways to manage gestational diabetes. Checking your blood glucose as often as told. Taking insulin or other diabetes medicines every day. These medicines will be prescribed only if needed. You may need to work with a diabetes specialist, or endocrinologist, on a treatment plan. The goal of treatment is to have the right blood glucose levels during your pregnancy. The levels are checked while you are fasting and after you eat. Your health care provider will tell you what those levels are. Follow these instructions at home: Learn about your condition Learn as much as you can about your condition. Ask your health care provider: How often should I check my blood glucose, and where do I get the equipment? What diabetes medicines do I need, and when should I take them? Do I need to meet  with a certified diabetes care and education specialist? What number can I call if I have questions? Where can I find a support group for people with gestational diabetes? General instructions Take over-the-counter and prescription medicines only as told by your health care provider. Manage your weight gain during pregnancy. Your expected weight gain depends on your BMI (body mass index) before pregnancy. Drink enough fluid to keep your urine pale yellow. Carry a medical alert card or wear medical alert jewelry that says you  have gestational diabetes. Keep all follow-up visits. This is important. Where to find more information American Diabetes Association (ADA): diabetes.org Association of Diabetes Care & Education Specialists (ADCES): diabeteseducator.org Centers for Disease Control and Prevention (CDC): TonerPromos.no American Pregnancy Association: americanpregnancy.org [place] Department of Agriculture MyPlate: WrestlingReporter.dk Contact a health care provider if you: Have a blood glucose level at or above: 240 mg/dL (96.2 mmol/L). 200 mg/dL (95.2 mmol/L), and you have ketones in your urine. Ketones are made by the liver when a lack of glucose forces the body to use fat for energy. Have a fever. Have been sick for 2 days or more and are not getting better. Have either of these problems for more than 6 hours: Vomiting every time you eat or drink. Diarrhea. Get help right away if you: Become confused or cannot think clearly. Have trouble breathing. Have moderate or high ketone levels in your urine. Feel that your baby is moving around less than usual. Develop unusual discharge or bleeding from your vagina. Start having early (premature) contractions. Contractions may feel like a tightening in your lower abdomen. Have a severe headache. These symptoms may represent a serious problem that is an emergency. Do not wait to see if the symptoms will go away. Get medical help right away. Call your local  emergency services (911 in the [place] Do not drive yourself to the hospital. Summary Gestational diabetes mellitus is a form of diabetes that some women get during pregnancy. It usually happens at weeks 24-28 of pregnancy and goes away after delivery. Treatment may include eating a healthy diet and getting more physical activity. You may also be given insulin or other diabetes medicines. Contact a health care provider if your glucose levels reach 240 mg/dL (84.1 mmol/L) or higher. Get help right away if you become confused or cannot think clearly, or you feel that your baby is moving around less than usual. This information is not intended to replace advice given to you by your health care provider. Make sure you discuss any questions you have with your health care provider. Document Revised: 04/23/2020 Document Reviewed: 04/23/2020 Elsevier Patient Education  2022 [employer].

## 2021-11-05 ENCOUNTER — Encounter: Payer: BC Managed Care – PPO | Attending: Obstetrics & Gynecology | Admitting: *Deleted

## 2021-11-05 ENCOUNTER — Encounter: Payer: Self-pay | Admitting: *Deleted

## 2021-11-05 ENCOUNTER — Other Ambulatory Visit: Payer: Self-pay

## 2021-11-05 VITALS — Ht 63.0 in | Wt 196.1 lb

## 2021-11-05 DIAGNOSIS — Z3A00 Weeks of gestation of pregnancy not specified: Secondary | ICD-10-CM | POA: Insufficient documentation

## 2021-11-05 DIAGNOSIS — O24410 Gestational diabetes mellitus in pregnancy, diet controlled: Secondary | ICD-10-CM | POA: Diagnosis not present

## 2021-11-05 NOTE — Progress Notes (Signed)
Diabetes Self-Management Education  Visit Type: First/Initial  Appt. Start Time: 1455 Appt. End Time: 1630  11/05/2021  [name], identified by name and date of birth, is a 35 y.o. female with a diagnosis of Diabetes: Gestational Diabetes.   ASSESSMENT  Blood pressure (P) 98/62, height 5\' 3"  (1.6 m), weight 196 lb 1.6 oz (89 kg), last menstrual period 04/22/2021, estimated date of delivery 01/27/2022 Body mass index is 34.74 kg/m.   Diabetes Self-Management Education - 11/05/21 1645       Visit Information   Visit Type First/Initial      Initial Visit   Diabetes Type Gestational Diabetes    Are you currently following a meal plan? Yes    What type of meal plan do you follow? "eating less carbs: white rice/bread"    Are you taking your medications as prescribed? Yes    Date Diagnosed 1 week      Health Coping   How would you rate your overall health? Good      Psychosocial Assessment   Patient Belief/Attitude about Diabetes Other (comment)   "sad"   Self-care barriers None    Self-management support Doctor's office;Family    Patient Concerns Nutrition/Meal planning;Glycemic Control;Monitoring    Special Needs None    Preferred Learning Style Auditory;Hands on    Learning Readiness Change in progress    How often do you need to have someone help you when you read instructions, pamphlets, or other written materials from your doctor or pharmacy? 1 - Never    What is the last grade level you completed in school? Masters      Pre-Education Assessment   Patient understands the diabetes disease and treatment process. Needs Instruction    Patient understands incorporating nutritional management into lifestyle. Needs Review    Patient undertands incorporating physical activity into lifestyle. Needs Review    Patient understands using medications safely. Needs Instruction    Patient understands monitoring blood glucose, interpreting and using results Needs Review     Patient understands prevention, detection, and treatment of acute complications. Needs Instruction    Patient understands prevention, detection, and treatment of chronic complications. Needs Instruction    Patient understands how to develop strategies to address psychosocial issues. Needs Instruction    Patient understands how to develop strategies to promote health/change behavior. Needs Instruction      Complications   How often do you check your blood sugar? 3-4 times/day    Fasting Blood glucose range (mg/dL) 14/06/22   FBG's 02-725 mg/dL  5/5 elevated   Postprandial Blood glucose range (mg/dL) 366-440   Pp breakfast 96-124 mg/dL 1/5 elevated; Pp lunch 97-134 mg/dL 2/5 elevated; Pp supper 107-139 mg/dL 2/4 elevated   Have you had a dilated eye exam in the past 12 months? No    Have you had a dental exam in the past 12 months? Yes    Are you checking your feet? No      Dietary Intake   Breakfast eggs, bread    Snack (morning) peanut butter crackers, peanuts, 34-742;595-638 yogurt, fruit (grapes, strawberries, kiwi)    Lunch chicken, broccoli, zucchini    Snack (afternoon) 2 and 4 pm - same as morning snacks    Dinner beef, chicken, pork, fish; potaotes, rice, peas, beans, corn, carrots, onions, cuccumbers, squash, peppers    Beverage(s) water      Exercise   Exercise Type Moderate (swimming / aerobic walking)    How many days per week to you  exercise? 4    How many minutes per day do you exercise? 50    Total minutes per week of exercise 200      Patient Education   Previous Diabetes Education No    Disease state  Definition of diabetes, type 1 and 2, and the diagnosis of diabetes;Factors that contribute to the development of diabetes    Nutrition management  Role of diet in the treatment of diabetes and the relationship between the three main macronutrients and blood glucose level;Food label reading, portion sizes and measuring food.;Reviewed blood glucose goals for pre and post meals  and how to evaluate the patients' food intake on their blood glucose level.    Physical activity and exercise  Role of exercise on diabetes management, blood pressure control and cardiac health.    Medications Other (comment)   Limited use of oral medications during pregnancy and potential for insulin.   Monitoring Purpose and frequency of SMBG.;Taught/discussed recording of test results and interpretation of SMBG.;Identified appropriate SMBG and/or A1C goals.;Ketone testing, when, how.    Chronic complications Relationship between chronic complications and blood glucose control    Psychosocial adjustment Identified and addressed patients feelings and concerns about diabetes    Preconception care Pregnancy and GDM  Role of pre-pregnancy blood glucose control on the development of the fetus;Reviewed with patient blood glucose goals with pregnancy;Role of family planning for patients with diabetes      Individualized Goals (developed by patient)   Reducing Risk Other (comment)   improve blood sugars, prevent diabetes complications     Outcomes   Expected Outcomes Demonstrated interest in learning. Expect positive outcomes    Future DMSE 2 wks        Individualized Plan for Diabetes Self-Management Training:   Learning Objective:  Patient will have a greater understanding of diabetes self-management. Patient education plan is to attend individual and/or group sessions per assessed needs and concerns.   Plan:   Patient Instructions  Read booklet on Gestational Diabetes Follow Gestational Meal Planning Guidelines Allow 2-3 hours between meals and snacks Complete a 3 Day Food Record and bring to next appointment Check blood sugars 4 x day - before breakfast and 2 hrs after every meal and record  Bring blood sugar log to all appointments Purchase urine ketone strips if instructed by MD and check urine ketones every am:  If + increase bedtime snack to 1 protein and 2 carbohydrate  servings Walk 20-30 minutes at least 5 x week if permitted by MD  Expected Outcomes:  Demonstrated interest in learning. Expect positive outcomes  Education material provided:  Gestational Booklet Gestational Meal Planning Guidelines Simple Meal Plan Viewed Gestational Diabetes Video 3 Day Food Record Goals for a Healthy Pregnancy   If problems or questions, patient to contact team via:   Sharion Settler, RN, CCM, CDCES [phone]  Future DSME appointment: 2 wks November 18, 2021 with the dietitian

## 2021-11-05 NOTE — Patient Instructions (Signed)
Read booklet on Gestational Diabetes Follow Gestational Meal Planning Guidelines Allow 2-3 hours between meals and snacks Complete a 3 Day Food Record and bring to next appointment Check blood sugars 4 x day - before breakfast and 2 hrs after every meal and record  Bring blood sugar log to all appointments Purchase urine ketone strips if instructed by MD and check urine ketones every am:  If + increase bedtime snack to 1 protein and 2 carbohydrate servings Walk 20-30 minutes at least 5 x week if permitted by MD  

## 2021-11-07 ENCOUNTER — Other Ambulatory Visit: Payer: Self-pay

## 2021-11-07 ENCOUNTER — Ambulatory Visit (INDEPENDENT_AMBULATORY_CARE_PROVIDER_SITE_OTHER): Payer: BC Managed Care – PPO | Admitting: Obstetrics and Gynecology

## 2021-11-07 ENCOUNTER — Encounter: Payer: Self-pay | Admitting: Obstetrics and Gynecology

## 2021-11-07 VITALS — BP 110/70 | Wt 194.4 lb

## 2021-11-07 DIAGNOSIS — O24414 Gestational diabetes mellitus in pregnancy, insulin controlled: Secondary | ICD-10-CM

## 2021-11-07 DIAGNOSIS — O24410 Gestational diabetes mellitus in pregnancy, diet controlled: Secondary | ICD-10-CM

## 2021-11-07 DIAGNOSIS — O0990 Supervision of high risk pregnancy, unspecified, unspecified trimester: Secondary | ICD-10-CM

## 2021-11-07 DIAGNOSIS — Z3A28 28 weeks gestation of pregnancy: Secondary | ICD-10-CM

## 2021-11-07 LAB — POCT URINALYSIS DIPSTICK OB
Glucose, UA: NEGATIVE
POC,PROTEIN,UA: NEGATIVE

## 2021-11-07 MED ORDER — INSULIN PEN NEEDLE 31G X 5 MM MISC: 1.0000 | Freq: Every day | 11 refills | Status: DC

## 2021-11-07 MED ORDER — INSULIN GLARGINE 100 UNITS/ML SOLOSTAR PEN: 5.0000 [IU] | PEN_INJECTOR | Freq: Every day | SUBCUTANEOUS | 11 refills | Status: DC

## 2021-11-07 NOTE — Progress Notes (Signed)
Routine Prenatal Care Visit  Subjective  Renee Gonzalez is a 35 y.o. G1P0 at [redacted]w[redacted]d being seen today for ongoing prenatal care.  She is currently monitored for the following issues for this high-risk pregnancy and has Asthma in adult, severe persistent, with acute exacerbation; Supervision of high risk pregnancy, antepartum; Primigravida of advanced maternal age in first trimester; and Diet controlled gestational diabetes mellitus (GDM) in third trimester on their problem list.  ----------------------------------------------------------------------------------- Patient reports no complaints.   Contractions: Not present. Vag. Bleeding: None.  Movement: Present. Denies leaking of fluid.  ----------------------------------------------------------------------------------- The following portions of the patient's history were reviewed and updated as appropriate: allergies, current medications, past family history, past medical history, past social history, past surgical history and problem list. Problem list updated.   Objective  Blood pressure 110/70, weight 194 lb 6.4 oz (88.2 kg), last menstrual period 04/22/2021. Pregravid weight 175 lb (79.4 kg) Total Weight Gain 19 lb 6.4 oz (8.8 kg) Urinalysis:      Fetal Status: Fetal Heart Rate (bpm): 130 Fundal Height: 32 cm Movement: Present     General:  Alert, oriented and cooperative. Patient is in no acute distress.  Skin: Skin is warm and dry. No rash noted.   Cardiovascular: Normal heart rate noted  Respiratory: Normal respiratory effort, no problems with respiration noted  Abdomen: Soft, gravid, appropriate for gestational age. Pain/Pressure: Absent     Pelvic:  Cervical exam deferred        Extremities: Normal range of motion.  Edema: None  Mental Status: Normal mood and affect. Normal behavior. Normal judgment and thought content.     Assessment   35 y.o. G1P0 at [redacted]w[redacted]d by  01/27/2022, by Last Menstrual Period presenting for routine  prenatal visit  Plan   pregnancy 1 Problems (from 06/17/21 to present)     Problem Noted Resolved   Supervision of high risk pregnancy, antepartum 06/17/2021 by Imagene Riches, CNM No   Overview Addendum 10/31/2021  5:28 PM by Homero Fellers, MD     Nursing Staff Provider  Office Location  [facility] Dating  LMP= 10 wk [place]  Language  English or Spanish Anatomy US  Complete, low ying placenta  Flu Vaccine  10/29/21 Genetic Screen  MaternT: nml XX  TDaP vaccine    Hgb A1C or  GTT Third trimester : 214  Covid Vaccinated Had in 2021   LAB RESULTS   Rhogam  n/a Blood Type O/Positive/-- (08/09 1532)   Feeding Plan Breast Antibody Negative (08/09 1532)  Contraception POP Rubella 7.86 (08/09 1532)  Circumcision n/a RPR Non Reactive (08/09 1532)   Pediatrician   HBsAg Negative (08/09 1532)   Support Person Husband HIV Non Reactive (08/09 1532)  Prenatal Classes Discussed Varicella  immune    GBS  (For PCN allergy, check sensitivities)   BTL Consent Not needed    VBAC Consent n/a Pap  7/22 nml  Advanced maternal age in pregnancy. Gestational diabetes      Primigravida of advanced maternal age in first trimester 06/17/2021 by Imagene Riches, CNM No         Glucose values reviewed. Elevated AM values, recommended checking BG occasionally at night to access for hypoglycemia. Recommended initiation of Lantus 5 units at night.  Gestational age appropriate obstetric precautions including but not limited to vaginal bleeding, contractions, leaking of fluid and fetal movement were reviewed in detail with the patient.    Return in about 1 week (around 11/14/2021) for HROB with  MD weekly for 2 visits.  Natale Milch MD [facility], [facility] 11/07/2021, 12:06 PM

## 2021-11-15 ENCOUNTER — Other Ambulatory Visit: Payer: Self-pay

## 2021-11-15 ENCOUNTER — Ambulatory Visit (INDEPENDENT_AMBULATORY_CARE_PROVIDER_SITE_OTHER): Payer: BC Managed Care – PPO | Admitting: Obstetrics and Gynecology

## 2021-11-15 ENCOUNTER — Encounter: Payer: Self-pay | Admitting: Obstetrics and Gynecology

## 2021-11-15 VITALS — BP 106/70 | Ht 63.0 in | Wt 194.8 lb

## 2021-11-15 DIAGNOSIS — O0990 Supervision of high risk pregnancy, unspecified, unspecified trimester: Secondary | ICD-10-CM

## 2021-11-15 DIAGNOSIS — O24410 Gestational diabetes mellitus in pregnancy, diet controlled: Secondary | ICD-10-CM

## 2021-11-15 DIAGNOSIS — O09511 Supervision of elderly primigravida, first trimester: Secondary | ICD-10-CM

## 2021-11-15 DIAGNOSIS — Z3A28 28 weeks gestation of pregnancy: Secondary | ICD-10-CM

## 2021-11-15 NOTE — Progress Notes (Signed)
Routine Prenatal Care Visit  Subjective  Renee Gonzalez is a 35 y.o. G1P0 at [redacted]w[redacted]d being seen today for ongoing prenatal care.  She is currently monitored for the following issues for this high-risk pregnancy and has Asthma in adult, severe persistent, with acute exacerbation; Supervision of high risk pregnancy, antepartum; Primigravida of advanced maternal age in first trimester; and Diet controlled gestational diabetes mellitus (GDM) in third trimester on their problem list.  ----------------------------------------------------------------------------------- Patient reports no complaints.   Contractions: Not present. Vag. Bleeding: None.  Movement: Present. Denies leaking of fluid.  ----------------------------------------------------------------------------------- The following portions of the patient's history were reviewed and updated as appropriate: allergies, current medications, past family history, past medical history, past social history, past surgical history and problem list. Problem list updated.   Objective  Blood pressure 106/70, height 5\' 3"  (1.6 m), weight 194 lb 12.8 oz (88.4 kg), last menstrual period 04/22/2021. Pregravid weight 175 lb (79.4 kg) Total Weight Gain 19 lb 12.8 oz (8.981 kg) Urinalysis:      Fetal Status: Fetal Heart Rate (bpm): 145   Movement: Present     General:  Alert, oriented and cooperative. Patient is in no acute distress.  Skin: Skin is warm and dry. No rash noted.   Cardiovascular: Normal heart rate noted  Respiratory: Normal respiratory effort, no problems with respiration noted  Abdomen: Soft, gravid, appropriate for gestational age. Pain/Pressure: Absent     Pelvic:  Cervical exam deferred        Extremities: Normal range of motion.  Edema: None  Mental Status: Normal mood and affect. Normal behavior. Normal judgment and thought content.     Assessment   35 y.o. G1P0 at [redacted]w[redacted]d by  01/27/2022, by Last Menstrual Period presenting for  routine prenatal visit  Plan   pregnancy 1 Problems (from 06/17/21 to present)     Problem Noted Resolved   Supervision of high risk pregnancy, antepartum 06/17/2021 by 06/19/2021, CNM No   Overview Addendum 11/07/2021 12:07 PM by 14/07/2021, MD     Nursing Staff Provider  Office Location  [facility]ating  LMP= 10 wk Natale Milch  Language  English or Spanish Anatomy US  Complete, low ying placenta  Flu Vaccine  10/29/21 Genetic Screen  MaternT: nml XX  TDaP vaccine    Hgb A1C or  GTT Third trimester : 214  Covid Vaccinated Had in 2021   LAB RESULTS   Rhogam  n/a Blood Type O/Positive/-- (08/09 1532)   Feeding Plan Breast Antibody Negative (08/09 1532)  Contraception POP Rubella 7.86 (08/09 1532)  Circumcision n/a RPR Non Reactive (08/09 1532)   Pediatrician   HBsAg Negative (08/09 1532)   Support Person Husband HIV Non Reactive (08/09 1532)  Prenatal Classes Discussed Varicella  immune    GBS  (For PCN allergy, check sensitivities)   BTL Consent Not needed    VBAC Consent n/a Pap  7/22 nml  Advanced maternal age in pregnancy. Gestational diabetes  18/07/2021- started lantus 5 units at night      Primigravida of advanced maternal age in first trimester 06/17/2021 by 06/19/2021, CNM No       Not able to fill lantus, but glucose levels improved and not needed at this time.  Gestational age appropriate obstetric precautions including but not limited to vaginal bleeding, contractions, leaking of fluid and fetal movement were reviewed in detail with the patient.    Return in about 1 week (around 11/22/2021) for HROB Weekly for  4 weeks with MD.  Homero Fellers MD [facility], [facility] 11/15/2021, 1:12 PM

## 2021-11-18 ENCOUNTER — Other Ambulatory Visit: Payer: Self-pay

## 2021-11-18 ENCOUNTER — Encounter: Payer: BC Managed Care – PPO | Admitting: Dietician

## 2021-11-18 ENCOUNTER — Encounter: Payer: Self-pay | Admitting: Dietician

## 2021-11-18 VITALS — Ht 63.0 in | Wt 192.8 lb

## 2021-11-18 DIAGNOSIS — O24410 Gestational diabetes mellitus in pregnancy, diet controlled: Secondary | ICD-10-CM | POA: Diagnosis not present

## 2021-11-18 NOTE — Progress Notes (Signed)
Patient's BG record indicates fasting BGs ranging 86-101, and post-meal BGs ranging 93-125. She has tested twice at 3am with results of 82 and 94. Patient's food diary indicateseating at regular intervals, protein sources with each meal and snack, some fluctuation in carb intake with some meals low in carbs. Multiple high fiber and whole grain choices with plenty of low carb vegetables.  Provided basic balanced meal plan, and wrote individualized menus based on patient's food preferences. Instructed patient on food safety, including avoidance of Listeriosis, and limiting mercury from fish. Discussed importance of maintaining healthy lifestyle habits to reduce risk of Type 2 DM as well as Gestational DM with any future pregnancies. Advised patient to use any remaining testing supplies to test some BGs after delivery, and to have BG tested ideally annually, as well as prior to attempting future pregnancies.

## 2021-11-18 NOTE — Patient Instructions (Signed)
Continue with current eating pattern and regular exercise. Include at least 2 servings (but not more than 3) of carbs with each meal.

## 2021-11-19 ENCOUNTER — Ambulatory Visit (INDEPENDENT_AMBULATORY_CARE_PROVIDER_SITE_OTHER): Payer: BC Managed Care – PPO | Admitting: Obstetrics and Gynecology

## 2021-11-19 VITALS — BP 100/66 | Wt 192.0 lb

## 2021-11-19 DIAGNOSIS — Z3A30 30 weeks gestation of pregnancy: Secondary | ICD-10-CM

## 2021-11-19 DIAGNOSIS — O4440 Low lying placenta NOS or without hemorrhage, unspecified trimester: Secondary | ICD-10-CM

## 2021-11-19 DIAGNOSIS — O24410 Gestational diabetes mellitus in pregnancy, diet controlled: Secondary | ICD-10-CM

## 2021-11-19 DIAGNOSIS — O0990 Supervision of high risk pregnancy, unspecified, unspecified trimester: Secondary | ICD-10-CM

## 2021-11-19 DIAGNOSIS — O09511 Supervision of elderly primigravida, first trimester: Secondary | ICD-10-CM

## 2021-11-19 NOTE — Progress Notes (Signed)
°  Subjective  Fetal Movement? yes Contractions? no Leaking Fluid? no Vaginal Bleeding? no PNVs? yes  Objective  BP 100/66    Wt 192 lb (87.1 kg)    LMP 04/22/2021 (Exact Date)    BMI 34.01 kg/m  General: NAD Pulmonary: no increased work of breathing Abdomen: gravid, non-tender Extremities: no edema Psychiatric: mood appropriate, affect full  Assessment  35 y.o. G1P0 at [redacted]w[redacted]d by  01/27/2022, by Last Menstrual Period presenting for routine prenatal visit  Plan   Problem List Items Addressed This Visit      Endocrine   Diet controlled gestational diabetes mellitus (GDM) in third trimester     Other   Supervision of high risk pregnancy, antepartum - Primary   Primigravida of advanced maternal age in first trimester  Other Visit Diagnoses    [redacted] weeks gestation of pregnancy       Low-lying placenta        TdAp 12/8/ per pt (not documented in chart); BT papers signed today. Breast/POPs Pt seeing nutritionist for GDM. Not on lantus due to insurance coverage issues. BS have been good so Dr. Jerene Pitch decided against insulin at last visit. Per pt chart, fasting BS 92-100, highest 109 once. 2 hr PP BS 89-119, highest 125 once.   Has OB u/s 12/03/21 for [facility].  Doing well, feels good.     Renee Gonzalez B. Renee Puckett, PA-C [facility],  11/19/2021  9:47 AM

## 2021-11-19 NOTE — Progress Notes (Signed)
ROB- no concerns 

## 2021-11-28 ENCOUNTER — Other Ambulatory Visit: Payer: Self-pay

## 2021-11-28 DIAGNOSIS — O0990 Supervision of high risk pregnancy, unspecified, unspecified trimester: Secondary | ICD-10-CM

## 2021-11-28 DIAGNOSIS — J4551 Severe persistent asthma with (acute) exacerbation: Secondary | ICD-10-CM

## 2021-11-28 DIAGNOSIS — O24410 Gestational diabetes mellitus in pregnancy, diet controlled: Secondary | ICD-10-CM

## 2021-12-01 NOTE — L&D Delivery Note (Signed)
Vaginal Delivery Note  Spontaneous delivery of live viable female infant from the ROA position through an intact perineum. Delivery of anterior left shoulder with gentle downward guidance followed by delivery of the right posterior shoulder with gentle upward guidance. Body followed spontaneously. Infant placed on maternal chest. Nursery present and helped with neonatal resuscitation and evaluation. Cord clamped and cut after one minute. Cord blood collected. Placenta delivered spontaneously and intact with a 3 vessel cord.  Right labial and 1st degree laceration, repaired.   Postpartum hemorrhage. Was given Cytotec, Methergine, Pitocin, and Hemabate. Bleeding continued so a Mel Almond was place. Bleeding promptly improved.  Uterus firm and below umbilicus at the end of the delivery. Jada in place.    Mom and baby recovering in stable condition. Sponge and needle counts were correct at the end of the delivery.  APGARS: 1 minute:7 5 minutes: 9 Weight: 3050 grams Epidural present  Right labial and 1st degree laceration.  [name] MD [facility], [facility] 01/02/22 11:11 PM

## 2021-12-03 ENCOUNTER — Ambulatory Visit: Payer: BC Managed Care – PPO | Attending: Obstetrics and Gynecology

## 2021-12-03 ENCOUNTER — Other Ambulatory Visit: Payer: Self-pay

## 2021-12-03 DIAGNOSIS — J4551 Severe persistent asthma with (acute) exacerbation: Secondary | ICD-10-CM | POA: Diagnosis not present

## 2021-12-03 DIAGNOSIS — O09522 Supervision of elderly multigravida, second trimester: Secondary | ICD-10-CM

## 2021-12-03 DIAGNOSIS — O24410 Gestational diabetes mellitus in pregnancy, diet controlled: Secondary | ICD-10-CM | POA: Diagnosis not present

## 2021-12-03 DIAGNOSIS — O99513 Diseases of the respiratory system complicating pregnancy, third trimester: Secondary | ICD-10-CM | POA: Insufficient documentation

## 2021-12-03 DIAGNOSIS — O09523 Supervision of elderly multigravida, third trimester: Secondary | ICD-10-CM | POA: Diagnosis not present

## 2021-12-03 DIAGNOSIS — Z3A32 32 weeks gestation of pregnancy: Secondary | ICD-10-CM | POA: Diagnosis not present

## 2021-12-03 DIAGNOSIS — O0990 Supervision of high risk pregnancy, unspecified, unspecified trimester: Secondary | ICD-10-CM

## 2021-12-04 ENCOUNTER — Ambulatory Visit (INDEPENDENT_AMBULATORY_CARE_PROVIDER_SITE_OTHER): Payer: BC Managed Care – PPO | Admitting: Obstetrics

## 2021-12-04 VITALS — BP 124/74 | Wt 195.0 lb

## 2021-12-04 DIAGNOSIS — Z3A32 32 weeks gestation of pregnancy: Secondary | ICD-10-CM

## 2021-12-04 DIAGNOSIS — O0990 Supervision of high risk pregnancy, unspecified, unspecified trimester: Secondary | ICD-10-CM

## 2021-12-04 NOTE — Progress Notes (Signed)
No vb. No lof.  

## 2021-12-04 NOTE — Progress Notes (Signed)
Routine Prenatal Care Visit  Subjective  Renee Gonzalez is a 36 y.o. G1P0 at [redacted]w[redacted]d being seen today for ongoing prenatal care.  She is currently monitored for the following issues for this high-risk pregnancy and has Asthma in adult, severe persistent, with acute exacerbation; Supervision of high risk pregnancy, antepartum; Primigravida of advanced maternal age in first trimester; and Diet controlled gestational diabetes mellitus (GDM) in third trimester on their problem list.  ----------------------------------------------------------------------------------- Patient reports no complaints.  She continues to exercise and has lost weight following a diabetic diet. Some elevated values noted today: her FBS measures run 96-108; her 2 hr PP run 108-133. Contractions: Not present. Vag. Bleeding: None.  Movement: Present. Leaking Fluid denies.  ----------------------------------------------------------------------------------- The following portions of the patient's history were reviewed and updated as appropriate: allergies, current medications, past family history, past medical history, past social history, past surgical history and problem list. Problem list updated.  Objective  Blood pressure 124/74, weight 195 lb (88.5 kg), last menstrual period 04/22/2021. Pregravid weight 175 lb (79.4 kg) Total Weight Gain 20 lb (9.072 kg) Urinalysis: Urine Protein    Urine Glucose    Fetal Status:     Movement: Present     General:  Alert, oriented and cooperative. Patient is in no acute distress.  Skin: Skin is warm and dry. No rash noted.   Cardiovascular: Normal heart rate noted  Respiratory: Normal respiratory effort, no problems with respiration noted  Abdomen: Soft, gravid, appropriate for gestational age. Pain/Pressure: Absent     Pelvic:  Cervical exam deferred        Extremities: Normal range of motion.     Mental Status: Normal mood and affect. Normal behavior. Normal judgment and thought  content.   Assessment   36 y.o. G1P0 at [redacted]w[redacted]d by  01/27/2022, by Last Menstrual Period presenting for routine prenatal visit  Plan   pregnancy 1 Problems (from 06/17/21 to present)    Problem Noted Resolved   Supervision of high risk pregnancy, antepartum 06/17/2021 by Imagene Riches, CNM No   Overview Addendum 11/15/2021  1:16 PM by Homero Fellers, MD     Nursing Staff Provider  Office Location  [facility] Dating  LMP= 10 wk [place]  Language  English or Spanish Anatomy US  Complete, low ying placenta  Flu Vaccine  10/29/21 Genetic Screen  MaternT: nml XX  TDaP vaccine    Hgb A1C or  GTT Third trimester : 214  Covid Vaccinated Had in 2021   LAB RESULTS   Rhogam  n/a Blood Type O/Positive/-- (08/09 1532)   Feeding Plan Breast Antibody Negative (08/09 1532)  Contraception POP Rubella 7.86 (08/09 1532)  Circumcision n/a RPR Non Reactive (08/09 1532)   Pediatrician   HBsAg Negative (08/09 1532)   Support Person Husband HIV Non Reactive (08/09 1532)  Prenatal Classes Discussed Varicella  immune    GBS  (For PCN allergy, check sensitivities)   BTL Consent Not needed    VBAC Consent n/a Pap  7/22 nml  Advanced maternal age in pregnancy. Gestational diabetes  11/07/2021- started lantus 5 units at night  11/15/2021- not able to fill lantus, but glucose levels improved and not needed at this time      Primigravida of advanced maternal age in first trimester 06/17/2021 by Imagene Riches, CNM No       Preterm labor symptoms and general obstetric precautions including but not limited to vaginal bleeding, contractions, leaking of fluid and fetal movement were reviewed in detail  with the patient. Please refer to After Visit Summary for other counseling recommendations. We reivewed her dietary choices  And m ade some adjustments ( no grapes :)  Return in about 2 weeks (around 12/18/2021) for return OB with MD please. diabetic.  Imagene Riches, CNM  12/04/2021 4:15 PM

## 2021-12-17 ENCOUNTER — Other Ambulatory Visit: Payer: Self-pay

## 2021-12-17 ENCOUNTER — Ambulatory Visit (INDEPENDENT_AMBULATORY_CARE_PROVIDER_SITE_OTHER): Payer: BC Managed Care – PPO | Admitting: Obstetrics and Gynecology

## 2021-12-17 VITALS — BP 120/80 | Wt 192.0 lb

## 2021-12-17 DIAGNOSIS — O09511 Supervision of elderly primigravida, first trimester: Secondary | ICD-10-CM

## 2021-12-17 DIAGNOSIS — Z3A34 34 weeks gestation of pregnancy: Secondary | ICD-10-CM

## 2021-12-17 DIAGNOSIS — O24410 Gestational diabetes mellitus in pregnancy, diet controlled: Secondary | ICD-10-CM

## 2021-12-17 DIAGNOSIS — O0990 Supervision of high risk pregnancy, unspecified, unspecified trimester: Secondary | ICD-10-CM

## 2021-12-17 NOTE — Progress Notes (Signed)
Routine Prenatal Care Visit  Subjective  Renee Gonzalez is a 36 y.o. G1P0 at [redacted]w[redacted]d being seen today for ongoing prenatal care.  She is currently monitored for the following issues for this high-risk pregnancy and has Asthma in adult, severe persistent, with acute exacerbation; Supervision of high risk pregnancy, antepartum; Primigravida of advanced maternal age in first trimester; and Diet controlled gestational diabetes mellitus (GDM) in third trimester on their problem list.  ----------------------------------------------------------------------------------- Patient reports no complaints.   Contractions: Not present. Vag. Bleeding: None.  Movement: Present. Denies leaking of fluid.  ----------------------------------------------------------------------------------- The following portions of the patient's history were reviewed and updated as appropriate: allergies, current medications, past family history, past medical history, past social history, past surgical history and problem list. Problem list updated.   Objective  Blood pressure 120/80, weight 192 lb (87.1 kg), last menstrual period 04/22/2021. Pregravid weight 175 lb (79.4 kg) Total Weight Gain 17 lb (7.711 kg) Urinalysis:      Fetal Status: Fetal Heart Rate (bpm): 150 Fundal Height: 34 cm Movement: Present     General:  Alert, oriented and cooperative. Patient is in no acute distress.  Skin: Skin is warm and dry. No rash noted.   Cardiovascular: Normal heart rate noted  Respiratory: Normal respiratory effort, no problems with respiration noted  Abdomen: Soft, gravid, appropriate for gestational age. Pain/Pressure: Present     Pelvic:  Cervical exam deferred        Extremities: Normal range of motion.     ental Status: Normal mood and affect. Normal behavior. Normal judgment and thought content.   Date Fasting Breakfast Lunch Dinner  1-9 85 120 105 117  1-10 100 103 101 120  1-11 114 101 107 109  1-12 97 114 112 118   1-13 93 116 99 120  1-14 83 120 119 117  1-15 100 118 94 118  1-16 86 115 114 120  1-17 98 94 105     Assessment   35 y.o. G1P0 at [redacted]w[redacted]d by  01/27/2022, by Last Menstrual Period presenting for routine prenatal visit  Plan   pregnancy 1 Problems (from 06/17/21 to present)     Problem Noted Resolved   Supervision of high risk pregnancy, antepartum 06/17/2021 by Mirna Mires, CNM No   Overview Addendum 11/15/2021  1:16 PM by Natale Milch, MD     Nursing Staff Provider  Office Location  [facility] Dating  LMP= 10 wk [place]  Language  English or Spanish Anatomy US  Complete, low ying placenta  Flu Vaccine  10/29/21 Genetic Screen  MaternT: nml XX  TDaP vaccine    Hgb A1C or  GTT Third trimester : 214  Covid Vaccinated Had in 2021   LAB RESULTS   Rhogam  n/a Blood Type O/Positive/-- (08/09 1532)   Feeding Plan Breast Antibody Negative (08/09 1532)  Contraception POP Rubella 7.86 (08/09 1532)  Circumcision n/a RPR Non Reactive (08/09 1532)   Pediatrician   HBsAg Negative (08/09 1532)   Support Person Husband HIV Non Reactive (08/09 1532)  Prenatal Classes Discussed Varicella  immune    GBS  (For PCN allergy, check sensitivities)   BTL Consent Not needed    VBAC Consent n/a Pap  7/22 nml  Advanced maternal age in pregnancy. Gestational diabetes  11/07/2021- started lantus 5 units at night  11/15/2021- not able to fill lantus, but glucose levels improved and not needed at this time      Primigravida of advanced maternal age in  first trimester 06/17/2021 by Imagene Riches, CNM No        Gestational age appropriate obstetric precautions including but not limited to vaginal bleeding, contractions, leaking of fluid and fetal movement were reviewed in detail with the patient.    - BG reviewed remain well controlled on diet  Return in about 1 week (around 12/24/2021) for [place].  Malachy Mood, MD, [facility], [facility] 12/17/2021, 4:10  PM

## 2021-12-23 ENCOUNTER — Ambulatory Visit (INDEPENDENT_AMBULATORY_CARE_PROVIDER_SITE_OTHER): Payer: BC Managed Care – PPO | Admitting: Licensed Practical Nurse

## 2021-12-23 ENCOUNTER — Encounter: Payer: Self-pay | Admitting: Licensed Practical Nurse

## 2021-12-23 ENCOUNTER — Other Ambulatory Visit: Payer: Self-pay

## 2021-12-23 VITALS — BP 122/74 | Wt 191.0 lb

## 2021-12-23 DIAGNOSIS — Z3A35 35 weeks gestation of pregnancy: Secondary | ICD-10-CM

## 2021-12-23 DIAGNOSIS — O0990 Supervision of high risk pregnancy, unspecified, unspecified trimester: Secondary | ICD-10-CM

## 2021-12-23 NOTE — Progress Notes (Signed)
Routine Prenatal Care Visit  Subjective  Renee Gonzalez is a 36 y.o. G1P0 at [redacted]w[redacted]d being seen today for ongoing prenatal care.  She is currently monitored for the following issues for this high-risk pregnancy and has Asthma in adult, severe persistent, with acute exacerbation; Supervision of high risk pregnancy, antepartum; Primigravida of advanced maternal age in first trimester; and Diet controlled gestational diabetes mellitus (GDM) in third trimester on their problem list.  ----------------------------------------------------------------------------------- Patient reports  has had some side pain and vaginal pressure/discomfort .  Rec using a support belt -GDM well controlled, 4/6 fastings elevated, all others WNL. -Going to [facility] this weekend, warning signs and travel precautions given.  Contractions: Not present. Vag. Bleeding: None.  Movement: Present. Leaking Fluid denies.  ----------------------------------------------------------------------------------- The following portions of the patient's history were reviewed and updated as appropriate: allergies, current medications, past family history, past medical history, past social history, past surgical history and problem list. Problem list updated.  Objective  Blood pressure 122/74, weight 191 lb (86.6 kg), last menstrual period 04/22/2021. Pregravid weight 175 lb (79.4 kg) Total Weight Gain 16 lb (7.258 kg) Urinalysis: Urine Protein    Urine Glucose    Fetal Status: Fetal Heart Rate (bpm): 140 Fundal Height: 36 cm Movement: Present     General:  Alert, oriented and cooperative. Patient is in no acute distress.  Skin: Skin is warm and dry. No rash noted.   Cardiovascular: Normal heart rate noted  Respiratory: Normal respiratory effort, no problems with respiration noted  Abdomen: Soft, gravid, appropriate for gestational age. Pain/Pressure: Absent     Pelvic:  Cervical exam deferred        Extremities: Normal range of motion.   Edema: None  Mental Status: Normal mood and affect. Normal behavior. Normal judgment and thought content.   Assessment   36 y.o. G1P0 at [redacted]w[redacted]d by  01/27/2022, by Last Menstrual Period presenting for routine prenatal visit  Plan   pregnancy 1 Problems (from 06/17/21 to present)     Problem Noted Resolved   Supervision of high risk pregnancy, antepartum 06/17/2021 by Mirna Mires, CNM No   Overview Addendum 11/15/2021  1:16 PM by Natale Milch, MD     Nursing Staff Provider  Office Location  [facility] Dating  LMP= 10 wk [place]  Language  English or Spanish Anatomy US  Complete, low ying placenta  Flu Vaccine  10/29/21 Genetic Screen  MaternT: nml XX  TDaP vaccine    Hgb A1C or  GTT Third trimester : 214  Covid Vaccinated Had in 2021   LAB RESULTS   Rhogam  n/a Blood Type O/Positive/-- (08/09 1532)   Feeding Plan Breast Antibody Negative (08/09 1532)  Contraception POP Rubella 7.86 (08/09 1532)  Circumcision n/a RPR Non Reactive (08/09 1532)   Pediatrician   HBsAg Negative (08/09 1532)   Support Person Husband HIV Non Reactive (08/09 1532)  Prenatal Classes Discussed Varicella  immune    GBS  (For PCN allergy, check sensitivities)   BTL Consent Not needed    VBAC Consent n/a Pap  7/22 nml  Advanced maternal age in pregnancy. Gestational diabetes  11/07/2021- started lantus 5 units at night  11/15/2021- not able to fill lantus, but glucose levels improved and not needed at this time      Primigravida of advanced maternal age in first trimester 06/17/2021 by Mirna Mires, CNM No        Preterm labor symptoms and general obstetric precautions including but not limited  to vaginal bleeding, contractions, leaking of fluid and fetal movement were reviewed in detail with the patient. Please refer to After Visit Summary for other counseling recommendations.   Return in about 1 week (around 12/30/2021) for ROB.  36 week labs next visit Growth [place] next week   Carie Caddy, CNM  [facility]  12/23/21  4:14 PM

## 2021-12-23 NOTE — Progress Notes (Signed)
No vb. No lof.  

## 2021-12-31 ENCOUNTER — Other Ambulatory Visit: Payer: Self-pay

## 2021-12-31 ENCOUNTER — Encounter: Payer: Self-pay | Admitting: Obstetrics and Gynecology

## 2021-12-31 ENCOUNTER — Ambulatory Visit (INDEPENDENT_AMBULATORY_CARE_PROVIDER_SITE_OTHER): Payer: BC Managed Care – PPO | Admitting: Obstetrics and Gynecology

## 2021-12-31 ENCOUNTER — Other Ambulatory Visit (HOSPITAL_COMMUNITY)
Admission: RE | Admit: 2021-12-31 | Discharge: 2021-12-31 | Disposition: A | Discharge status: Home / Self Care | Payer: BC Managed Care – PPO | Source: Ambulatory Visit | Attending: Obstetrics and Gynecology | Admitting: Obstetrics and Gynecology

## 2021-12-31 VITALS — BP 118/70 | Wt 186.9 lb

## 2021-12-31 DIAGNOSIS — Z3A36 36 weeks gestation of pregnancy: Secondary | ICD-10-CM | POA: Insufficient documentation

## 2021-12-31 DIAGNOSIS — O09511 Supervision of elderly primigravida, first trimester: Secondary | ICD-10-CM

## 2021-12-31 DIAGNOSIS — J4551 Severe persistent asthma with (acute) exacerbation: Secondary | ICD-10-CM

## 2021-12-31 DIAGNOSIS — O0990 Supervision of high risk pregnancy, unspecified, unspecified trimester: Secondary | ICD-10-CM | POA: Diagnosis present

## 2021-12-31 DIAGNOSIS — O09513 Supervision of elderly primigravida, third trimester: Secondary | ICD-10-CM | POA: Insufficient documentation

## 2021-12-31 DIAGNOSIS — K649 Unspecified hemorrhoids: Secondary | ICD-10-CM | POA: Insufficient documentation

## 2021-12-31 DIAGNOSIS — O24410 Gestational diabetes mellitus in pregnancy, diet controlled: Secondary | ICD-10-CM

## 2021-12-31 DIAGNOSIS — O24419 Gestational diabetes mellitus in pregnancy, unspecified control: Secondary | ICD-10-CM | POA: Insufficient documentation

## 2021-12-31 DIAGNOSIS — O99513 Diseases of the respiratory system complicating pregnancy, third trimester: Secondary | ICD-10-CM | POA: Insufficient documentation

## 2021-12-31 DIAGNOSIS — O4690 Antepartum hemorrhage, unspecified, unspecified trimester: Secondary | ICD-10-CM

## 2021-12-31 MED ORDER — HYDROCORTISONE ACETATE 25 MG RE SUPP: 25.0000 mg | Freq: Two times a day (BID) | RECTAL | 1 refills | Status: DC

## 2021-12-31 NOTE — Progress Notes (Signed)
Routine Prenatal Care Visit  Subjective  Renee Gonzalez is a 36 y.o. G1P0 at [redacted]w[redacted]d being seen today for ongoing prenatal care.  She is currently monitored for the following issues for this low-risk pregnancy and has Asthma in adult, severe persistent, with acute exacerbation; Supervision of high risk pregnancy, antepartum; Primigravida of advanced maternal age in first trimester; and Diet controlled gestational diabetes mellitus (GDM) in third trimester on their problem list.  ----------------------------------------------------------------------------------- Patient reports  hemorrhoidal pain which is making it difficult to sit .   Contractions: Irregular. Vag. Bleeding: None.  Movement: Present. Denies leaking of fluid.  ----------------------------------------------------------------------------------- The following portions of the patient's history were reviewed and updated as appropriate: allergies, current medications, past family history, past medical history, past social history, past surgical history and problem list. Problem list updated.   Objective  Blood pressure 118/70, weight 186 lb 14.4 oz (84.8 kg), last menstrual period 04/22/2021. Pregravid weight 175 lb (79.4 kg) Total Weight Gain 11 lb 14.4 oz (5.398 kg) Urinalysis:      Fetal Status:     Movement: Present     General:  Alert, oriented and cooperative. Patient is in no acute distress.  Skin: Skin is warm and dry. No rash noted.   Cardiovascular: Normal heart rate noted  Respiratory: Normal respiratory effort, no problems with respiration noted  Abdomen: Soft, gravid, appropriate for gestational age. Pain/Pressure: Absent     Pelvic:  Cervical exam deferred        Extremities: Normal range of motion.     Mental Status: Normal mood and affect. Normal behavior. Normal judgment and thought content.     Assessment   36 y.o. G1P0 at [redacted]w[redacted]d by  01/27/2022, by Last Menstrual Period presenting for routine prenatal  visit  Plan   pregnancy 1 Problems (from 06/17/21 to present)     Problem Noted Resolved   Supervision of high risk pregnancy, antepartum 06/17/2021 by Mirna Mires, CNM No   Overview Addendum 11/15/2021  1:16 PM by Natale Milch, MD     Nursing Staff Provider  Office Location  [facility] Dating  LMP= 10 wk [place]  Language  English or Spanish Anatomy US  Complete, low ying placenta  Flu Vaccine  10/29/21 Genetic Screen  MaternT: nml XX  TDaP vaccine    Hgb A1C or  GTT Third trimester : 214  Covid Vaccinated Had in 2021   LAB RESULTS   Rhogam  n/a Blood Type O/Positive/-- (08/09 1532)   Feeding Plan Breast Antibody Negative (08/09 1532)  Contraception POP Rubella 7.86 (08/09 1532)  Circumcision n/a RPR Non Reactive (08/09 1532)   Pediatrician   HBsAg Negative (08/09 1532)   Support Person Husband HIV Non Reactive (08/09 1532)  Prenatal Classes Discussed Varicella  immune    GBS  (For PCN allergy, check sensitivities)   BTL Consent Not needed    VBAC Consent n/a Pap  7/22 nml  Advanced maternal age in pregnancy. Gestational diabetes  11/07/2021- started lantus 5 units at night  11/15/2021- not able to fill lantus, but glucose levels improved and not needed at this time      Primigravida of advanced maternal age in first trimester 06/17/2021 by Mirna Mires, CNM No       Hemorrhoid pain- Anusol ordered Glucose log reviewed- well controlled on diet alone GBS, GC. CT today  Gestational age appropriate obstetric precautions including but not limited to vaginal bleeding, contractions, leaking of fluid and fetal movement were reviewed  in detail with the patient.    Return in about 1 week (around 01/07/2022) for [place] in person.  Natale Milch MD [facility], [facility] 12/31/2021, 4:18 PM

## 2021-12-31 NOTE — Patient Instructions (Signed)
Hemorrhoids °Hemorrhoids are swollen veins in and around the rectum or anus. There are two types of hemorrhoids: °Internal hemorrhoids. These occur in the veins that are just inside the rectum. They may poke through to the outside and become irritated and painful. °External hemorrhoids. These occur in the veins that are outside the anus and can be felt as a painful swelling or hard lump near the anus. °Most hemorrhoids do not cause serious problems, and they can be managed with home treatments such as diet and lifestyle changes. If home treatments do not help the symptoms, procedures can be done to shrink or remove the hemorrhoids. °What are the causes? °This condition is caused by increased pressure in the anal area. This pressure may result from various things, including: °Constipation. °Straining to have a bowel movement. °Diarrhea. °Pregnancy. °Obesity. °Sitting for long periods of time. °Heavy lifting or other activity that causes you to strain. °Anal sex. °Riding a bike for a long period of time. °What are the signs or symptoms? °Symptoms of this condition include: °Pain. °Anal itching or irritation. °Rectal bleeding. °Leakage of stool (feces). °Anal swelling. °One or more lumps around the anus. °How is this diagnosed? °This condition can often be diagnosed through a visual exam. Other exams or tests may also be done, such as: °An exam that involves feeling the rectal area with a gloved hand (digital rectal exam). °An exam of the anal canal that is done using a small tube (anoscope). °A blood test, if you have lost a significant amount of blood. °A test to look inside the colon using a flexible tube with a camera on the end (sigmoidoscopy or colonoscopy). °How is this treated? °This condition can usually be treated at home. However, various procedures may be done if dietary changes, lifestyle changes, and other home treatments do not help your symptoms. These procedures can help make the hemorrhoids smaller or  remove them completely. Some of these procedures involve surgery, and others do not. Common procedures include: °Rubber band ligation. Rubber bands are placed at the base of the hemorrhoids to cut off their blood supply. °Sclerotherapy. Medicine is injected into the hemorrhoids to shrink them. °Infrared coagulation. A type of light energy is used to get rid of the hemorrhoids. °Hemorrhoidectomy surgery. The hemorrhoids are surgically removed, and the veins that supply them are tied off. °Stapled hemorrhoidopexy surgery. The surgeon staples the base of the hemorrhoid to the rectal wall. °Follow these instructions at home: °Eating and drinking ° °Eat foods that have a lot of fiber in them, such as whole grains, beans, nuts, fruits, and vegetables. °Ask your health care provider about taking products that have added fiber (fiber supplements). °Reduce the amount of fat in your diet. You can do this by eating low-fat dairy products, eating less red meat, and avoiding processed foods. °Drink enough fluid to keep your urine pale yellow. °Managing pain and swelling ° °Take warm sitz baths for 20 minutes, 3-4 times a day to ease pain and discomfort. You may do this in a bathtub or using a portable sitz bath that fits over the toilet. °If directed, apply ice to the affected area. Using ice packs between sitz baths may be helpful. °Put ice in a plastic bag. °Place a towel between your skin and the bag. °Leave the ice on for 20 minutes, 2-3 times a day. °General instructions °Take over-the-counter and prescription medicines only as told by your health care provider. °Use medicated creams or suppositories as told. °Get regular exercise.   Ask your health care provider how much and what kind of exercise is best for you. In general, you should do moderate exercise for at least 30 minutes on most days of the week (150 minutes each week). This can include activities such as walking, biking, or yoga. °Go to the bathroom when you have  the urge to have a bowel movement. Do not wait. °Avoid straining to have bowel movements. °Keep the anal area dry and clean. Use wet toilet paper or moist towelettes after a bowel movement. °Do not sit on the toilet for long periods of time. This increases blood pooling and pain. °Keep all follow-up visits as told by your health care provider. This is important. °Contact a health care provider if you have: °Increasing pain and swelling that are not controlled by treatment or medicine. °Difficulty having a bowel movement, or you are unable to have a bowel movement. °Pain or inflammation outside the area of the hemorrhoids. °Get help right away if you have: °Uncontrolled bleeding from your rectum. °Summary °Hemorrhoids are swollen veins in and around the rectum or anus. °Most hemorrhoids can be managed with home treatments such as diet and lifestyle changes. °Taking warm sitz baths can help ease pain and discomfort. °In severe cases, procedures or surgery can be done to shrink or remove the hemorrhoids. °This information is not intended to replace advice given to you by your health care provider. Make sure you discuss any questions you have with your health care provider. °Document Revised: 05/29/2021 Document Reviewed: 05/29/2021 °Elsevier Patient Education © 2022 [employer] Inc. ° °

## 2022-01-02 ENCOUNTER — Inpatient Hospital Stay
Admission: EM | Admit: 2022-01-02 | Discharge: 2022-01-04 | DRG: 768 | Disposition: A | Discharge status: Home / Self Care | Payer: BC Managed Care – PPO | Attending: Licensed Practical Nurse | Admitting: Licensed Practical Nurse

## 2022-01-02 ENCOUNTER — Inpatient Hospital Stay: Payer: BC Managed Care – PPO | Admitting: Anesthesiology

## 2022-01-02 ENCOUNTER — Ambulatory Visit: Payer: BC Managed Care – PPO

## 2022-01-02 ENCOUNTER — Telehealth: Payer: Self-pay

## 2022-01-02 ENCOUNTER — Other Ambulatory Visit: Payer: Self-pay

## 2022-01-02 ENCOUNTER — Encounter: Payer: Self-pay | Admitting: Obstetrics and Gynecology

## 2022-01-02 DIAGNOSIS — O42013 Preterm premature rupture of membranes, onset of labor within 24 hours of rupture, third trimester: Secondary | ICD-10-CM

## 2022-01-02 DIAGNOSIS — O9903 Anemia complicating the puerperium: Secondary | ICD-10-CM | POA: Diagnosis not present

## 2022-01-02 DIAGNOSIS — O42913 Preterm premature rupture of membranes, unspecified as to length of time between rupture and onset of labor, third trimester: Secondary | ICD-10-CM | POA: Diagnosis present

## 2022-01-02 DIAGNOSIS — O9952 Diseases of the respiratory system complicating childbirth: Secondary | ICD-10-CM | POA: Diagnosis present

## 2022-01-02 DIAGNOSIS — O26893 Other specified pregnancy related conditions, third trimester: Secondary | ICD-10-CM | POA: Diagnosis present

## 2022-01-02 DIAGNOSIS — Z20822 Contact with and (suspected) exposure to covid-19: Secondary | ICD-10-CM | POA: Diagnosis present

## 2022-01-02 DIAGNOSIS — D62 Acute posthemorrhagic anemia: Secondary | ICD-10-CM | POA: Diagnosis not present

## 2022-01-02 DIAGNOSIS — J45909 Unspecified asthma, uncomplicated: Secondary | ICD-10-CM | POA: Diagnosis present

## 2022-01-02 DIAGNOSIS — Z3A36 36 weeks gestation of pregnancy: Secondary | ICD-10-CM | POA: Diagnosis not present

## 2022-01-02 DIAGNOSIS — O9081 Anemia of the puerperium: Secondary | ICD-10-CM | POA: Diagnosis not present

## 2022-01-02 DIAGNOSIS — O411230 Chorioamnionitis, third trimester, not applicable or unspecified: Secondary | ICD-10-CM | POA: Diagnosis present

## 2022-01-02 DIAGNOSIS — O09511 Supervision of elderly primigravida, first trimester: Secondary | ICD-10-CM

## 2022-01-02 DIAGNOSIS — O24420 Gestational diabetes mellitus in childbirth, diet controlled: Secondary | ICD-10-CM | POA: Diagnosis present

## 2022-01-02 DIAGNOSIS — O4290 Premature rupture of membranes, unspecified as to length of time between rupture and onset of labor, unspecified weeks of gestation: Secondary | ICD-10-CM

## 2022-01-02 DIAGNOSIS — O24410 Gestational diabetes mellitus in pregnancy, diet controlled: Secondary | ICD-10-CM | POA: Diagnosis not present

## 2022-01-02 DIAGNOSIS — O0990 Supervision of high risk pregnancy, unspecified, unspecified trimester: Principal | ICD-10-CM

## 2022-01-02 LAB — CERVICOVAGINAL ANCILLARY ONLY
Chlamydia: NEGATIVE
Comment: NEGATIVE
Comment: NEGATIVE
Comment: NORMAL
Neisseria Gonorrhea: NEGATIVE
Trichomonas: NEGATIVE

## 2022-01-02 LAB — GLUCOSE, CAPILLARY
Glucose-Capillary: 79 mg/dL (ref 70–99)
Glucose-Capillary: 83 mg/dL (ref 70–99)
Glucose-Capillary: 97 mg/dL (ref 70–99)
Glucose-Capillary: 95 mg/dL (ref 70–99)

## 2022-01-02 LAB — CBC
HCT: 36.7 % (ref 36.0–46.0)
Hemoglobin: 12.3 g/dL (ref 12.0–15.0)
MCH: 30.1 pg (ref 26.0–34.0)
MCHC: 33.5 g/dL (ref 30.0–36.0)
MCV: 89.7 fL (ref 80.0–100.0)
Platelets: 386 10*3/uL (ref 150–400)
RBC: 4.09 MIL/uL (ref 3.87–5.11)
RDW: 13.3 % (ref 11.5–15.5)
WBC: 8.4 10*3/uL (ref 4.0–10.5)
nRBC: 0 % (ref 0.0–0.2)

## 2022-01-02 LAB — HIV ANTIBODY (ROUTINE TESTING W REFLEX): HIV Screen 4th Generation wRfx: NONREACTIVE

## 2022-01-02 LAB — TYPE AND SCREEN
ABO/RH(D): O POS
Antibody Screen: NEGATIVE

## 2022-01-02 LAB — RESP PANEL BY RT-PCR (FLU A&B, COVID) ARPGX2
Influenza A by PCR: NEGATIVE
Influenza B by PCR: NEGATIVE
SARS Coronavirus 2 by RT PCR: NEGATIVE

## 2022-01-02 LAB — ABO/RH: ABO/RH(D): O POS

## 2022-01-02 MED ORDER — DIPHENHYDRAMINE HCL 50 MG/ML IJ SOLN: 12.5000 mg | INTRAMUSCULAR | Status: DC | PRN

## 2022-01-02 MED ORDER — MISOPROSTOL 200 MCG PO TABS
ORAL_TABLET | ORAL | Status: AC
  Filled 2022-01-02: qty 4

## 2022-01-02 MED ORDER — LIDOCAINE HCL (PF) 1 % IJ SOLN
30.0000 mL | INTRAMUSCULAR | Status: AC | PRN
  Administered 2022-01-02: 30 mL via SUBCUTANEOUS
  Filled 2022-01-02: qty 30

## 2022-01-02 MED ORDER — EPHEDRINE 5 MG/ML INJ: 10.0000 mg | INTRAVENOUS | Status: DC | PRN

## 2022-01-02 MED ORDER — SODIUM CHLORIDE 0.9 % IV SOLN: 2.0000 g | Freq: Four times a day (QID) | INTRAVENOUS | Status: DC

## 2022-01-02 MED ORDER — METHYLERGONOVINE MALEATE 0.2 MG/ML IJ SOLN
0.2000 mg | Freq: Once | INTRAMUSCULAR | Status: AC
  Administered 2022-01-02: 0.2 mg via INTRAMUSCULAR

## 2022-01-02 MED ORDER — LIDOCAINE HCL (PF) 1 % IJ SOLN
INTRAMUSCULAR | Status: DC | PRN
  Administered 2022-01-02 (×2): 3 mL via SUBCUTANEOUS

## 2022-01-02 MED ORDER — PENICILLIN G POT IN DEXTROSE 60000 UNIT/ML IV SOLN
3.0000 10*6.[IU] | INTRAVENOUS | Status: DC
  Administered 2022-01-02 (×2): 3 10*6.[IU] via INTRAVENOUS
  Filled 2022-01-02 (×2): qty 50

## 2022-01-02 MED ORDER — PHENYLEPHRINE 40 MCG/ML (10ML) SYRINGE FOR IV PUSH (FOR BLOOD PRESSURE SUPPORT): 80.0000 ug | PREFILLED_SYRINGE | INTRAVENOUS | Status: DC | PRN

## 2022-01-02 MED ORDER — DIPHENOXYLATE-ATROPINE 2.5-0.025 MG PO TABS
1.0000 | ORAL_TABLET | Freq: Once | ORAL | Status: AC
  Administered 2022-01-02: 1 via ORAL
  Filled 2022-01-02: qty 1

## 2022-01-02 MED ORDER — TERBUTALINE SULFATE 1 MG/ML IJ SOLN: 0.2500 mg | Freq: Once | INTRAMUSCULAR | Status: DC | PRN

## 2022-01-02 MED ORDER — GENTAMICIN SULFATE 40 MG/ML IJ SOLN
330.0000 mg | INTRAVENOUS | Status: DC
  Administered 2022-01-02: 330 mg via INTRAVENOUS
  Filled 2022-01-02: qty 8.25

## 2022-01-02 MED ORDER — METHYLERGONOVINE MALEATE 0.2 MG/ML IJ SOLN
INTRAMUSCULAR | Status: AC
  Filled 2022-01-02: qty 1

## 2022-01-02 MED ORDER — BUTORPHANOL TARTRATE 1 MG/ML IJ SOLN: 1.0000 mg | INTRAMUSCULAR | Status: DC | PRN

## 2022-01-02 MED ORDER — ACETAMINOPHEN 500 MG PO TABS
ORAL_TABLET | ORAL | Status: AC
  Filled 2022-01-02: qty 2

## 2022-01-02 MED ORDER — LACTATED RINGERS IV SOLN: INTRAVENOUS | Status: DC

## 2022-01-02 MED ORDER — SODIUM CHLORIDE 0.9 % IV SOLN
INTRAVENOUS | Status: AC
  Filled 2022-01-02: qty 2000

## 2022-01-02 MED ORDER — OXYTOCIN-SODIUM CHLORIDE 30-0.9 UT/500ML-% IV SOLN
1.0000 m[IU]/min | INTRAVENOUS | Status: DC
  Administered 2022-01-02: 2 m[IU]/min via INTRAVENOUS
  Filled 2022-01-02: qty 500

## 2022-01-02 MED ORDER — OXYTOCIN-SODIUM CHLORIDE 30-0.9 UT/500ML-% IV SOLN
2.5000 [IU]/h | INTRAVENOUS | Status: DC
  Administered 2022-01-03: 2.5 [IU]/h via INTRAVENOUS
  Filled 2022-01-02: qty 500

## 2022-01-02 MED ORDER — BUPIVACAINE HCL (PF) 0.25 % IJ SOLN
INTRAMUSCULAR | Status: DC | PRN
  Administered 2022-01-02 (×3): 4 mL via EPIDURAL

## 2022-01-02 MED ORDER — CARBOPROST TROMETHAMINE 250 MCG/ML IM SOLN
INTRAMUSCULAR | Status: AC
  Filled 2022-01-02: qty 1

## 2022-01-02 MED ORDER — LACTATED RINGERS IV SOLN
500.0000 mL | INTRAVENOUS | Status: DC | PRN
  Administered 2022-01-02: 500 mL via INTRAVENOUS

## 2022-01-02 MED ORDER — OXYTOCIN BOLUS FROM INFUSION
333.0000 mL | Freq: Once | INTRAVENOUS | Status: AC
  Administered 2022-01-02: 333 mL via INTRAVENOUS

## 2022-01-02 MED ORDER — MISOPROSTOL 25 MCG QUARTER TABLET
25.0000 ug | ORAL_TABLET | ORAL | Status: DC | PRN
  Administered 2022-01-02: 25 ug via VAGINAL
  Filled 2022-01-02: qty 1

## 2022-01-02 MED ORDER — ONDANSETRON HCL 4 MG/2ML IJ SOLN: 4.0000 mg | Freq: Four times a day (QID) | INTRAMUSCULAR | Status: DC | PRN

## 2022-01-02 MED ORDER — SOD CITRATE-CITRIC ACID 500-334 MG/5ML PO SOLN: 30.0000 mL | ORAL | Status: DC | PRN

## 2022-01-02 MED ORDER — LACTATED RINGERS IV SOLN
500.0000 mL | Freq: Once | INTRAVENOUS | Status: AC
  Administered 2022-01-02: 500 mL via INTRAVENOUS

## 2022-01-02 MED ORDER — CARBOPROST TROMETHAMINE 250 MCG/ML IM SOLN
250.0000 ug | Freq: Once | INTRAMUSCULAR | Status: AC
  Administered 2022-01-02: 250 ug via INTRAMUSCULAR

## 2022-01-02 MED ORDER — SODIUM CHLORIDE 0.9 % IV SOLN
5.0000 10*6.[IU] | Freq: Once | INTRAVENOUS | Status: AC
  Administered 2022-01-02: 5 10*6.[IU] via INTRAVENOUS
  Filled 2022-01-02: qty 5

## 2022-01-02 MED ORDER — OXYTOCIN 10 UNIT/ML IJ SOLN
INTRAMUSCULAR | Status: AC
  Filled 2022-01-02: qty 2

## 2022-01-02 MED ORDER — ACETAMINOPHEN 500 MG PO TABS
1000.0000 mg | ORAL_TABLET | Freq: Four times a day (QID) | ORAL | Status: DC | PRN
  Administered 2022-01-02: 1000 mg via ORAL

## 2022-01-02 MED ORDER — LIDOCAINE-EPINEPHRINE (PF) 1.5 %-1:200000 IJ SOLN
INTRAMUSCULAR | Status: DC | PRN
  Administered 2022-01-02: 4 mL via PERINEURAL

## 2022-01-02 MED ORDER — FENTANYL-BUPIVACAINE-NACL 0.5-0.125-0.9 MG/250ML-% EP SOLN
EPIDURAL | Status: AC
  Filled 2022-01-02: qty 250

## 2022-01-02 MED ORDER — MISOPROSTOL 200 MCG PO TABS
800.0000 ug | ORAL_TABLET | Freq: Once | ORAL | Status: AC
  Administered 2022-01-02: 800 ug via RECTAL

## 2022-01-02 MED ORDER — AMMONIA AROMATIC IN INHA
RESPIRATORY_TRACT | Status: AC
  Filled 2022-01-02: qty 10

## 2022-01-02 MED ORDER — FENTANYL-BUPIVACAINE-NACL 0.5-0.125-0.9 MG/250ML-% EP SOLN
12.0000 mL/h | EPIDURAL | Status: DC | PRN
  Administered 2022-01-02: 12 mL/h via EPIDURAL

## 2022-01-02 NOTE — Progress Notes (Signed)
° °  Subjective:  More comfortable with epidural.  RN assisting to change position as needed.   Objective:   Vitals: Blood pressure 113/67, pulse 89, temperature 98.7 F (37.1 C), temperature source Oral, resp. rate 16, height 5\' 3"  (1.6 m), weight 84.4 kg, last menstrual period 04/22/2021, SpO2 100 %. General: NAD Abdomen:nontender  soft resting tone  Cervical Exam:  Dilation: 1.5 Effacement (%): 90 Cervical Position: Middle Station: -1 Presentation: Vertex Exam by:: L Lakitha Gordy, CNM  FHT: baseline 130, moderate variably,  Toco: q 1-2,  Pitocin at 4 milli-units/min   Results for orders placed or performed during the hospital encounter of 01/02/22 (from the past 24 hour(s))  Resp Panel by RT-PCR (Flu A&B, Covid) Nasopharyngeal Swab     Status: None   Collection Time: 01/02/22  9:19 AM   Specimen: Nasopharyngeal Swab; Nasopharyngeal(NP) swabs in vial transport medium  Result Value Ref Range   SARS Coronavirus 2 by RT PCR NEGATIVE NEGATIVE   Influenza A by PCR NEGATIVE NEGATIVE   Influenza B by PCR NEGATIVE NEGATIVE  CBC     Status: None   Collection Time: 01/02/22  9:41 AM  Result Value Ref Range   WBC 8.4 4.0 - 10.5 K/uL   RBC 4.09 3.87 - 5.11 MIL/uL   Hemoglobin 12.3 12.0 - 15.0 g/dL   HCT 03/02/22 46.5 - 68.1 %   MCV 89.7 80.0 - 100.0 fL   MCH 30.1 26.0 - 34.0 pg   MCHC 33.5 30.0 - 36.0 g/dL   RDW 27.5 17.0 - 01.7 %   Platelets 386 150 - 400 K/uL   nRBC 0.0 0.0 - 0.2 %  Type and screen [facility]     Status: None   Collection Time: 01/02/22  9:41 AM  Result Value Ref Range   ABO/RH(D) O POS    Antibody Screen NEG    Sample Expiration      01/05/2022,2359 Performed at [facility], [address] [place], [place]   ABO/Rh     Status: None   Collection Time: 01/02/22 11:45 AM  Result Value Ref Range   ABO/RH(D)      O POS Performed at [facility], [address] [place], [place]   Glucose, capillary      Status: None   Collection Time: 01/02/22 12:13 PM  Result Value Ref Range   Glucose-Capillary 79 70 - 99 mg/dL   Comment 1 Notify RN   Glucose, capillary     Status: None   Collection Time: 01/02/22  2:11 PM  Result Value Ref Range   Glucose-Capillary 83 70 - 99 mg/dL   Comment 1 Notify RN   Glucose, capillary     Status: None   Collection Time: 01/02/22  4:26 PM  Result Value Ref Range   Glucose-Capillary 97 70 - 99 mg/dL   Comment 1 Notify RN     Assessment:   36 y.o. G1P0 [redacted]w[redacted]d admitted for PPROM  Plan:    1) Labor -ongoing induction secondary to PPROM, cytotec x1, Pitocin infusing. Forebag ruptured for thick mec   2) Category 1 tracing   3) Ruptured for mec since 0300, GBS unknown, antibiotics ordered.   4) GDM diet controlled, BS stable   5) Pain management: Epidural managed by Anesthesia   [redacted]w[redacted]d, CNM  Carie Caddy, [facility]  @TODAY @  4:51 PM

## 2022-01-02 NOTE — Telephone Encounter (Signed)
Pt called office first thing this morning; 36wks; woke up leaking fluid and has some bleeding; pt was told to go to L&D; Dondra Spry notified.

## 2022-01-02 NOTE — OB Triage Note (Signed)
Pt presents c/o LOF since 3am. Pt states she has had 2 huge gushes. Pt states fluid was initially was clear in color and now appears to be greenish-brown. Pt denies any bleeding. Pt reports more lower abdominal pressure and cramping. Pt reports positive fetal movement. Pt denies recent intercourse. VSS. Will continue to monitor.

## 2022-01-02 NOTE — Anesthesia Preprocedure Evaluation (Signed)
Anesthesia Evaluation  Patient identified by MRN, date of birth, ID band Patient awake    Reviewed: Allergy & Precautions, H&P , NPO status , Patient's Chart, lab work & pertinent test results  History of Anesthesia Complications Negative for: history of anesthetic complications  Airway Mallampati: II       Dental   Pulmonary asthma ,           Cardiovascular negative cardio ROS       Neuro/Psych    GI/Hepatic Neg liver ROS, GERD  ,  Endo/Other  diabetes  Renal/GU      Musculoskeletal   Abdominal   Peds  Hematology negative hematology ROS (+)   Anesthesia Other Findings   Reproductive/Obstetrics (+) Pregnancy                             Anesthesia Physical Anesthesia Plan  ASA: 3  Anesthesia Plan: Epidural   Post-op Pain Management:    Induction:   PONV Risk Score and Plan:   Airway Management Planned:   Additional Equipment:   Intra-op Plan:   Post-operative Plan:   Informed Consent:   Plan Discussed with: Anesthesiologist and CRNA  Anesthesia Plan Comments:         Anesthesia Quick Evaluation

## 2022-01-02 NOTE — Progress Notes (Addendum)
Pharmacy Antibiotic Note  Renee Gonzalez is a 36 y.o. female admitted on 01/02/2022 with  chorioamnionitis .  Pharmacy has been consulted for gentamicin dosing.  Plan: Gentamicin 330 mg every 24 hours Used Adj body weight 65 kg Gentamicin levels not indicated for chorioamnionitis. Follow for future gentamicin plans/duration.  Monitor renal function, LOT, clinical course.  Height: 5\' 3"  (160 cm) Weight: 84.4 kg (186 lb) IBW/kg (Calculated) : 52.4  Temp (24hrs), Avg:99.1 F (37.3 C), Min:98.5 F (36.9 C), Max:100.8 F (38.2 C)  Recent Labs  Lab 01/02/22 0941  WBC 8.4    CrCl cannot be calculated (Patient's most recent lab result is older than the maximum 21 days allowed.).    Allergies  Allergen Reactions   Cat Hair Extract Shortness Of Breath    Face & Throat swelling   Grass Extracts [Gramineae Pollens]     Antimicrobials this admission: Penicillin G 2/2 >> 2/2 Ampicillin 2/2 >>  Gentamicin 2/2 >>  Dose adjustments this admission: None  Microbiology results: None  Thank you for allowing pharmacy to be a part of this patients care.  Wynelle [place], PharmD Pharmacy Resident  01/02/2022 7:13 PM

## 2022-01-02 NOTE — H&P (Addendum)
OB History & Physical   History of Present Illness:  Chief Complaint:   HPI:  Fleta Kneisley is a 36 y.o. G1P0 female at [redacted]w[redacted]d dated by LMP and 10wk [place].  She presents to L&D for  leaking clear fluid since 0300.  She had early and regular prenatal care.  Her pregnancy has been complicated by GDM diet controlled.   She endorses +FM, has been contracting since this morning.   Pregnancy Issues: 1. Gestational diabetes diet controlled   Maternal Medical History:   Past Medical History:  Diagnosis Date   Asthma    Gestational diabetes     Past Surgical History:  Procedure Laterality Date   SHOULDER SURGERY Right     Allergies  Allergen Reactions   Cat Hair Extract Shortness Of Breath    Face & Throat swelling   Grass Extracts [Gramineae Pollens]     Prior to Admission medications   Medication Sig Start Date End Date Taking? Authorizing Provider  Accu-Chek Softclix Lancets lancets Use as instructed, one per blood sugar check four times daily 10/30/21  Yes Gae Dry, MD  albuterol (PROVENTIL HFA;VENTOLIN HFA) 108 (90 Base) MCG/ACT inhaler 2 puffs every 4-6 hours prn wheezing, cough 12/15/16  Yes Conty, Orlando, MD  albuterol (PROVENTIL) (2.5 MG/3ML) 0.083% nebulizer solution Take 3 mLs (2.5 mg total) by nebulization every 4 (four) hours as needed for wheezing or shortness of breath. 10/18/18  Yes Pyreddy, Reatha Harps, MD  Blood Glucose Monitoring Suppl (ACCU-CHEK NANO SMARTVIEW) w/Device KIT 1 kit by Subdermal route as directed. Check blood sugars for fasting, and two hours after breakfast, lunch and dinner (4 checks daily) 10/30/21  Yes Gae Dry, MD  FLOVENT HFA 220 MCG/ACT inhaler Inhale 2 puffs into the lungs 2 (two) times daily. 08/20/21  Yes [provider]  fluticasone (FLONASE) 50 MCG/ACT nasal spray Place 2 sprays into both nostrils daily. 04/02/21  Yes [provider]  glucose blood (ACCU-CHEK SMARTVIEW) test strip Use as instructed to check blood sugars  four times daily 10/30/21  Yes Gae Dry, MD  hydrocortisone (ANUSOL-HC) 25 MG suppository Place 1 suppository (25 mg total) rectally 2 (two) times daily. 12/31/21  Yes Schuman, Stefanie Libel, MD  Javier Docker Oil (OMEGA-3) 500 MG CAPS Take 1 capsule by mouth daily.   Yes [provider]  montelukast (SINGULAIR) 10 MG tablet Take 10 mg by mouth at bedtime.   Yes [provider]  Prenatal Vit-Fe Fumarate-FA (M-NATAL PLUS) 27-1 MG TABS Take 1 tablet by mouth daily. 06/05/21  Yes [provider]  vitamin B-12 (CYANOCOBALAMIN) 500 MCG tablet Take 500 mcg by mouth daily.   Yes [provider]  vitamin E 180 MG (400 UNITS) capsule Take 400 Units by mouth daily.   Yes [provider]     Prenatal care site: [facility]  Social History: She  reports that she has never smoked. She has never used smokeless tobacco. She reports that she does not currently use alcohol. She reports that she does not use drugs.  Family History: family history includes Asthma in her father and another family member; Diabetes in her mother and paternal grandmother; Diabetes Mellitus II in her mother; Parkinson's disease in her maternal grandmother.   Review of Systems: A full review of systems was performed and negative except as noted in the HPI.     Physical Exam:  Vital Signs: BP 128/82 (BP Location: Left Arm)    Pulse (!) 103    Temp 98.7  F (37.1 C) (Oral)    Resp 16    Ht $R'5\' 3"'fA$  (1.6 m)    Wt 84.4 kg    LMP 04/22/2021 (Exact Date)    SpO2 100%    BMI 32.95 kg/m  General: no acute distress.  HEENT: normocephalic, atraumatic Heart: regular rate & rhythm.  No murmurs/rubs/gallops Lungs: clear to auscultation bilaterally, normal respiratory effort Abdomen: soft, gravid, non-tender;  EFW: 7lbs Pelvic:   External: Normal external female genitalia  Cervix: Dilation: Fingertip /   / Station: -2    Extremities: non-tender, symmetric, no edema bilaterally.  Neurologic: Alert &  oriented x 3.    BSUS confirms vertex presentation  No results found for this or any previous visit (from the past 24 hour(s)).  Pertinent Results:  Prenatal Labs: Blood type/Rh O pos   Antibody screen neg  Rubella Immune  Varicella Immune  RPR NR  HBsAg Neg  HIV NR  GC neg  Chlamydia neg  Genetic screening negative  1 hour GTT elevated  3 hour GTT Abnormal   GBS Pending    SKA:JGOTLXBW 135, moderate variability, pos accel, neg decel TOCO:q 2-5, soft rest ton e SVE:  Dilation: Fingertip /   / Station: -2    [place] MFM OB FOLLOW UP  Result Date: 12/03/2021 ----------------------------------------------------------------------  OBSTETRICS REPORT                       (Signed Final 12/03/2021 04:17 pm) ---------------------------------------------------------------------- Patient Info  ID #:       [id number]                          D.O.B.:  07-12-1986 (35 yrs)  Name:       Enriqueta Shutter                    Visit Date: 12/03/2021 03:37 pm ---------------------------------------------------------------------- Performed By  Attending:        Tama High MD        Ref. Address:     [address], [place],                                                             [place] [address]  Performed By:     Wilnette Kales        [facility]                                 [facility]  Referred By:      Homero Fellers ---------------------------------------------------------------------- Orders  #  Description                           Code        Ordered By  1  [place] MFM OB FOLLOW UP                   63817.71    RAVI [facility] ----------------------------------------------------------------------  #  Order #                     Accession #                Episode #  1  [id number]                    [id number]                 [id number] ---------------------------------------------------------------------- Indications  Advanced maternal age multigravida 68+,        O12.522  second trimester  Gestational diabetes in pregnancy, diet        O24.410  controlled  Asthma                                         O99.89 j45.909  [redacted] weeks gestation of pregnancy                Z3A.32  NIPT:Nml, XX ---------------------------------------------------------------------- Fetal Evaluation  Num Of Fetuses:         1  Fetal Heart Rate(bpm):  137  Cardiac Activity:       Observed  Presentation:           Cephalic  Placenta:               Posterior  Amniotic Fluid  AFI FV:      Within normal limits  AFI Sum(cm)     %Tile       Largest Pocket(cm)  15.7            56          5.8  RUQ(cm)       RLQ(cm)       LUQ(cm)        LLQ(cm)  4             3.7           5.8            2.2 ---------------------------------------------------------------------- Biometry  BPD:      81.4  mm     G. Age:  32w 5d         59  %    CI:        73.79   %    70 - 86                                                          FL/HC:      20.4   %    19.1 - 21.3  HC:       301   mm     G. Age:  33w 3d         46  %    HC/AC:      0.99        0.96 - 1.17  AC:      304.2  mm     G. Age:  34w 3d         96  %    FL/BPD:     75.3   %    71 - 87  FL:       61.3  mm     G. Age:  31w 6d         28  %    FL/AC:      20.2   %    20 - 24  Est. FW:    2187  gm    4 lb 13 oz      79  % ---------------------------------------------------------------------- Gestational Age  LMP:           32w 1d        Date:  04/22/21                 EDD:   01/27/22  U/S Today:     33w 1d                                        EDD:   01/20/22  Best:          32w 1d     Det. By:  LMP  (04/22/21)          EDD:   01/27/22 ---------------------------------------------------------------------- Anatomy  Cranium:               Appears normal         LVOT:                   Previously seen  Cavum:                  Appears normal         Aortic Arch:            Previously seen  Ventricles:            Appears normal         Ductal Arch:            Previously seen  Choroid Plexus:        Previously seen        Diaphragm:              Appears normal  Cerebellum:            Appears normal         Stomach:                Appears normal, left                                                                        sided  Posterior Fossa:       Appears normal  Abdomen:                Appears normal  Nuchal Fold:           Not applicable (>74    Abdominal Wall:         Previously seen                         wks GA)  Face:                  Orbits and profile     Cord Vessels:           Appears normal (3                         previously seen                                vessel cord)  Lips:                  Previously seen        Kidneys:                Appear normal  Palate:                Previously seen        Bladder:                Appears normal  Thoracic:              Previously seen        Spine:                  Previously seen  Heart:                 Appears normal         Upper Extremities:      Previously seen                         (4CH, axis, and                         situs)  RVOT:                  Appears normal         Lower Extremities:      Previously seen  Other:  Fetus appears to be female. ---------------------------------------------------------------------- Impression  Gestational diabetes. Well-controlled on diet .  Amniotic fluid is normal and good fetal activity is seen .Fetal  growth is appropriate for gestational age .  Blood pressure today at her office is 119/78 mmHg.  We reassured the patient of the findings. ---------------------------------------------------------------------- Recommendations  -An appointment was made for her to return in 4 weeks for  fetal growth assessment. ----------------------------------------------------------------------                  Tama High, MD  Electronically Signed Final Report   12/03/2021 04:17 pm ----------------------------------------------------------------------   Assessment:  Katelynn Heidler is a 36 y.o. G1P0 female at [redacted]w[redacted]d with PPROM.   Plan:  Admit to Labor & Delivery CBC, T&S, Clrs, IVF, diabetic orders placed  GBS  unknown, antibiotics ordered  Consents obtained. Category 1 tracing, Continuous efm/toco Cytotec ordered Pain management: aware of all options, will ask if desired.   -----  Roberto Scales, Cecil

## 2022-01-02 NOTE — Anesthesia Procedure Notes (Signed)
Epidural Patient location during procedure: OB Start time: 01/02/2022 6:45 PM End time: 01/02/2022 6:58 PM  Staffing Anesthesiologist: Martha Clan, MD Resident/CRNA: Johnna Acosta, CRNA Performed: resident/CRNA   Preanesthetic Checklist Completed: patient identified, IV checked, site marked, risks and benefits discussed, surgical consent, monitors and equipment checked, pre-op evaluation and timeout performed  Epidural Patient position: sitting Prep: ChloraPrep Patient monitoring: heart rate, continuous pulse ox and blood pressure Approach: midline Location: L2-L3 Injection technique: LOR saline  Needle:  Needle type: Tuohy  Needle gauge: 17 G Needle length: 9 cm and 9 Needle insertion depth: 8 cm Catheter type: closed end flexible Catheter size: 19 Gauge Catheter at skin depth: 13 cm Test dose: negative and 1.5% lidocaine with Epi 1:200 K  Assessment Sensory level: T10 Events: blood not aspirated, injection not painful, no injection resistance, no paresthesia and negative IV test  Additional Notes 1 attempt Pt. Evaluated and documentation done after procedure finished. Patient identified. Risks/Benefits/Options discussed with patient including but not limited to bleeding, infection, nerve damage, paralysis, failed block, incomplete pain control, headache, blood pressure changes, nausea, vomiting, reactions to medication both or allergic, itching and postpartum back pain. Confirmed with bedside nurse the patient's most recent platelet count. Confirmed with patient that they are not currently taking any anticoagulation, have any bleeding history or any family history of bleeding disorders. Patient expressed understanding and wished to proceed. All questions were answered. Sterile technique was used throughout the entire procedure. Please see nursing notes for vital signs. Test dose was given through epidural catheter and negative prior to continuing to dose epidural or start  infusion. Warning signs of high block given to the patient including shortness of breath, tingling/numbness in hands, complete motor block, or any concerning symptoms with instructions to call for help. Patient was given instructions on fall risk and not to get out of bed. All questions and concerns addressed with instructions to call with any issues or inadequate analgesia.    Patient tolerated the insertion well without immediate complications.Reason for block:procedure for pain

## 2022-01-02 NOTE — Progress Notes (Signed)
Inpatient Diabetes Program Recommendations  Diabetes Treatment Program Recommendations  ADA Standards of Care  Diabetes in Pregnancy Target Glucose Ranges:  Fasting: 60 - 90 mg/dL Preprandial: 60 - 062 mg/dL 1 hr postprandial: Less than 140mg /dL (from first bite of meal) 2 hr postprandial: Less than 120 mg/dL (from first bite of meal)      Review of Glycemic Control  Diabetes history: GDM; [redacted]W[redacted]D Outpatient Diabetes medications: None; diet controlled Current orders for Inpatient glycemic control: CBGs Q2H   NOTE: Noted consult for diabetes coordinator. Per chart, patient [redacted]W[redacted]D gestation with hx of GDM which was diet controlled. No CBGs in chart at this time. Agree with CBGs Q2H. If CBGs 120 mg/dl or higher, recommend using IV insulin for glucose control.  Thanks, , RN, MSN, CDE Diabetes Coordinator Inpatient Diabetes Program [phone] (Team Pager from 8am to 5pm)

## 2022-01-02 NOTE — Anesthesia Procedure Notes (Signed)
Epidural Patient location during procedure: OB Start time: 01/02/2022 3:34 PM End time: 01/02/2022 3:54 PM  Staffing Anesthesiologist: Lenard Simmer, MD Resident/CRNA: Lynden Oxford, CRNA Performed: resident/CRNA   Preanesthetic Checklist Completed: patient identified, IV checked, site marked, risks and benefits discussed, monitors and equipment checked, pre-op evaluation and timeout performed  Epidural Patient position: sitting Prep: ChloraPrep Patient monitoring: heart rate, continuous pulse ox and blood pressure Approach: midline Location: L4-L5 Injection technique: LOR air  Needle:  Needle type: Tuohy  Needle gauge: 18 G Needle length: 9 cm and 9 Needle insertion depth: 6 cm Catheter type: closed end flexible Catheter size: 20 Guage Catheter at skin depth: 13 cm Test dose: negative and Other (0.25% bupivacaine)  Assessment Events: blood not aspirated, injection not painful, no injection resistance, no paresthesia and negative IV test  Additional Notes  Attempt x 2 with loss of resistance, but catheter would not thread through.  Last attempt successful at higher intervertebral space. Patient tolerated the insertion well without complications.Reason for block:procedure for pain

## 2022-01-02 NOTE — Discharge Summary (Signed)
Postpartum Discharge Summary  Date of Service updated2/03/2022     Patient Name: Renee Gonzalez DOB: [birth year] MRN: [id number]  Date of admission: 01/02/2022 Delivery date:01/02/2022  Delivering provider: Adrian Prows R  Date of discharge: 01/04/2022  Admitting diagnosis: Amniotic fluid leaking [O42.90] Intrauterine pregnancy: [redacted]w[redacted]d     Secondary diagnosis:  Active Problems:   Postpartum care following vaginal delivery  Additional problems: Postpartum hemorrhage    Discharge diagnosis: Preterm Pregnancy Delivered, GDM A1, and PPH , chorioamnionitis                                           Post partum procedures: none Augmentation: Pitocin and Cytotec Complications: [id number]  Hospital course: Induction of Labor With Vaginal Delivery   36 y.o. yo G1P0 at [redacted]w[redacted]d was admitted to the hospital 01/02/2022 for induction of labor.  Indication for induction: PROM.  Patient had an uncomplicated labor course as follows: Membrane Rupture Time/Date: 3:00 AM ,01/02/2022   Delivery Method:Vaginal, Spontaneous  Episiotomy: None  Lacerations:  1st degree;Labial  Details of delivery can be found in separate delivery note.  Patient had a routine postpartum course. Patient is discharged home 01/04/22.  Newborn Data: Birth date:01/02/2022  Birth time:9:50 PM  Gender:Female  Living status:Living  Apgars:7 ,9  Weight:3050 g   Magnesium Sulfate received: No BMZ received: No Rhophylac:No MMR:No T-DaP:Given prenatally Flu: Yes Transfusion:No  Physical exam  Vitals:   01/03/22 1625 01/04/22 0029 01/04/22 0752 01/04/22 0802  BP: 93/62 101/68 (!) 97/57   Pulse: 79 96 88   Resp: $Remo'18 20 18   'LTtVu$ Temp: 98 F (36.7 C) 97.7 F (36.5 C) 98 F (36.7 C)   TempSrc: Oral Oral Oral   SpO2:  97% 98% 97%  Weight:      Height:       General: alert, cooperative, and no distress Lochia: appropriate Uterine Fundus: firm Incision: Healing well with no significant drainage DVT Evaluation: No  evidence of DVT seen on physical exam. Negative Homan's sign. Labs: Lab Results  Component Value Date   WBC 12.5 (H) 01/03/2022   HGB 10.5 (L) 01/03/2022   HCT 30.6 (L) 01/03/2022   MCV 87.7 01/03/2022   PLT 343 01/03/2022   CMP Latest Ref Rng & Units 10/17/2018  Glucose 70 - 99 mg/dL 151(H)  BUN 6 - 20 mg/dL 10  Creatinine 0.44 - 1.00 mg/dL 0.76  Sodium 135 - 145 mmol/L 145  Potassium 3.5 - 5.1 mmol/L 4.0  Chloride 98 - 111 mmol/L 110  CO2 22 - 32 mmol/L 21(L)  Calcium 8.9 - 10.3 mg/dL 10.3  Total Protein 6.5 - 8.1 g/dL -  Total Bilirubin 0.3 - 1.2 mg/dL -  Alkaline Phos 38 - 126 U/L -  AST 15 - 41 U/L -  ALT 14 - 54 U/L -   Edinburgh Score: Edinburgh Postnatal Depression Scale Screening Tool 01/04/2022  I have been able to laugh and see the funny side of things. 0  I have looked forward with enjoyment to things. 0  I have blamed myself unnecessarily when things went wrong. 1  I have been anxious or worried for no good reason. 0  I have felt scared or panicky for no good reason. 1  Things have been getting on top of me. 2  I have been so unhappy that I have had difficulty sleeping. 1  I have felt  sad or miserable. 1  I have been so unhappy that I have been crying. 0  The thought of harming myself has occurred to me. 0  Edinburgh Postnatal Depression Scale Total 6      After visit meds:  Allergies as of 01/04/2022       Reactions   Cat Hair Extract Shortness Of Breath   Face & Throat swelling   Grass Extracts [gramineae Pollens]         Medication List     STOP taking these medications    Accu-Chek Nano SmartView w/Device Kit   Accu-Chek SmartView test strip Generic drug: glucose blood   Accu-Chek Softclix Lancets lancets   hydrocortisone 25 MG suppository Commonly known as: ANUSOL-HC       TAKE these medications    albuterol 108 (90 Base) MCG/ACT inhaler Commonly known as: VENTOLIN HFA 2 puffs every 4-6 hours prn wheezing, cough   albuterol  (2.5 MG/3ML) 0.083% nebulizer solution Commonly known as: PROVENTIL Take 3 mLs (2.5 mg total) by nebulization every 4 (four) hours as needed for wheezing or shortness of breath.   Flovent HFA 220 MCG/ACT inhaler Generic drug: fluticasone Inhale 2 puffs into the lungs 2 (two) times daily.   fluticasone 50 MCG/ACT nasal spray Commonly known as: FLONASE Place 2 sprays into both nostrils daily.   M-Natal Plus 27-1 MG Tabs Take 1 tablet by mouth daily.   montelukast 10 MG tablet Commonly known as: SINGULAIR Take 10 mg by mouth at bedtime.   Omega-3 500 MG Caps Take 1 capsule by mouth daily.   vitamin B-12 500 MCG tablet Commonly known as: CYANOCOBALAMIN Take 500 mcg by mouth daily.   vitamin E 180 MG (400 UNITS) capsule Take 400 Units by mouth daily.         Discharge home in stable condition Infant Feeding: Breast Infant Disposition:home with mother Discharge instruction: per After Visit Summary and Postpartum booklet. Activity: Advance as tolerated. Pelvic rest for 6 weeks.  Diet: routine diet Anticipated Birth Control: POPs Postpartum Appointment:2 weeks and again in 6 wks Additional Postpartum F/U: Postpartum Depression checkup Future Appointments: Future Appointments  Date Time Provider [place]  01/07/2022  3:35 PM Dominic, Nunzio Cobbs, CNM [facility] None   Follow up Visit:  Follow-up Information     Schuman, Stefanie Libel, MD. Schedule an appointment as soon as possible for a visit in 2 week(s).   Specialty: Obstetrics and Gynecology Contact information: [address] [place] [address] [phone]         Homero Fellers, MD. Schedule an appointment as soon as possible for a visit in 6 week(s).   Specialty: Obstetrics and Gynecology Why: Dillon Bjork make an appointment to see Dr. Gilman Schmidt in 6 weeks. Contact information: [place] [address] [phone]                     01/04/2022 Imagene Riches, CNM

## 2022-01-02 NOTE — Progress Notes (Signed)
° °  Subjective:  Feeling contraction pain.  Currently getting epidural.   Objective:   Vitals: Blood pressure 113/67, pulse 89, temperature 98.7 F (37.1 C), temperature source Oral, resp. rate 16, height 5\' 3"  (1.6 m), weight 84.4 kg, last menstrual period 04/22/2021, SpO2 100 %. General: NAD  Abdomen: gravid Cervical Exam:  Dilation: 1 Effacement (%): 90 Cervical Position: Middle Station: -1 Presentation: Vertex Exam by:: [email or web address] RN  FHT: baseline 140, pos accel, neg decl Toco:q 1-3, soft resting tone Pitocin at 4 milli-units/min   Results for orders placed or performed during the hospital encounter of 01/02/22 (from the past 24 hour(s))  Resp Panel by RT-PCR (Flu A&B, Covid) Nasopharyngeal Swab     Status: None   Collection Time: 01/02/22  9:19 AM   Specimen: Nasopharyngeal Swab; Nasopharyngeal(NP) swabs in vial transport medium  Result Value Ref Range   SARS Coronavirus 2 by RT PCR NEGATIVE NEGATIVE   Influenza A by PCR NEGATIVE NEGATIVE   Influenza B by PCR NEGATIVE NEGATIVE  CBC     Status: None   Collection Time: 01/02/22  9:41 AM  Result Value Ref Range   WBC 8.4 4.0 - 10.5 K/uL   RBC 4.09 3.87 - 5.11 MIL/uL   Hemoglobin 12.3 12.0 - 15.0 g/dL   HCT 03/02/22 89.1 - 69.4 %   MCV 89.7 80.0 - 100.0 fL   MCH 30.1 26.0 - 34.0 pg   MCHC 33.5 30.0 - 36.0 g/dL   RDW 50.3 88.8 - 28.0 %   Platelets 386 150 - 400 K/uL   nRBC 0.0 0.0 - 0.2 %  Type and screen [facility]     Status: None   Collection Time: 01/02/22  9:41 AM  Result Value Ref Range   ABO/RH(D) O POS    Antibody Screen NEG    Sample Expiration      01/05/2022,2359 Performed at [facility], [address] [place], [place]   ABO/Rh     Status: None   Collection Time: 01/02/22 11:45 AM  Result Value Ref Range   ABO/RH(D)      O POS Performed at [facility], [address] [place], [place]   Glucose, capillary     Status: None   Collection  Time: 01/02/22 12:13 PM  Result Value Ref Range   Glucose-Capillary 79 70 - 99 mg/dL   Comment 1 Notify RN   Glucose, capillary     Status: None   Collection Time: 01/02/22  2:11 PM  Result Value Ref Range   Glucose-Capillary 83 70 - 99 mg/dL   Comment 1 Notify RN     Assessment:   36 y.o. G1P0 [redacted]w[redacted]d admitted for PPROM  Plan:   1) Labor -ongoing induction secondary to PPROM, cytotec x1, Pitocin infusing.   2) Category 1 tracing  3) GBS unknown, antibiotics ordered.  4) GDM diet controlled, BS stable  5) Pain management: Epidural managed by Anesthesia  [redacted]w[redacted]d, CNM  [facility], [facility] 01/02/2022, 3:51 PM

## 2022-01-03 ENCOUNTER — Encounter: Payer: Self-pay | Admitting: Obstetrics and Gynecology

## 2022-01-03 LAB — CBC
HCT: 30.6 % — ABNORMAL LOW (ref 36.0–46.0)
Hemoglobin: 10.5 g/dL — ABNORMAL LOW (ref 12.0–15.0)
MCH: 30.1 pg (ref 26.0–34.0)
MCHC: 34.3 g/dL (ref 30.0–36.0)
MCV: 87.7 fL (ref 80.0–100.0)
Platelets: 343 10*3/uL (ref 150–400)
RBC: 3.49 MIL/uL — ABNORMAL LOW (ref 3.87–5.11)
RDW: 13.5 % (ref 11.5–15.5)
WBC: 12.5 10*3/uL — ABNORMAL HIGH (ref 4.0–10.5)
nRBC: 0 % (ref 0.0–0.2)

## 2022-01-03 LAB — RPR: RPR Ser Ql: NONREACTIVE

## 2022-01-03 MED ORDER — IBUPROFEN 600 MG PO TABS
600.0000 mg | ORAL_TABLET | Freq: Four times a day (QID) | ORAL | Status: DC
  Administered 2022-01-03 – 2022-01-04 (×4): 600 mg via ORAL
  Filled 2022-01-03 (×4): qty 1

## 2022-01-03 MED ORDER — DIBUCAINE (PERIANAL) 1 % EX OINT: 1.0000 "application " | TOPICAL_OINTMENT | CUTANEOUS | Status: DC | PRN

## 2022-01-03 MED ORDER — ALBUTEROL SULFATE (2.5 MG/3ML) 0.083% IN NEBU: 2.5000 mg | INHALATION_SOLUTION | RESPIRATORY_TRACT | Status: DC | PRN

## 2022-01-03 MED ORDER — HYDROCORTISONE ACETATE 25 MG RE SUPP
25.0000 mg | Freq: Two times a day (BID) | RECTAL | Status: DC
  Administered 2022-01-03 (×2): 25 mg via RECTAL
  Filled 2022-01-03 (×5): qty 1

## 2022-01-03 MED ORDER — DOCUSATE SODIUM 100 MG PO CAPS
100.0000 mg | ORAL_CAPSULE | Freq: Two times a day (BID) | ORAL | Status: DC
  Administered 2022-01-04: 100 mg via ORAL
  Filled 2022-01-03: qty 1

## 2022-01-03 MED ORDER — PRENATAL MULTIVITAMIN CH
1.0000 | ORAL_TABLET | Freq: Every day | ORAL | Status: DC
  Administered 2022-01-03 – 2022-01-04 (×2): 1 via ORAL
  Filled 2022-01-03 (×2): qty 1

## 2022-01-03 MED ORDER — WITCH HAZEL-GLYCERIN EX PADS
1.0000 "application " | MEDICATED_PAD | CUTANEOUS | Status: DC | PRN
  Administered 2022-01-03: 1 via TOPICAL
  Filled 2022-01-03: qty 100

## 2022-01-03 MED ORDER — ONDANSETRON HCL 4 MG/2ML IJ SOLN: 4.0000 mg | INTRAMUSCULAR | Status: DC | PRN

## 2022-01-03 MED ORDER — SIMETHICONE 80 MG PO CHEW: 80.0000 mg | CHEWABLE_TABLET | ORAL | Status: DC | PRN

## 2022-01-03 MED ORDER — BUDESONIDE 0.5 MG/2ML IN SUSP
1.0000 mg | Freq: Two times a day (BID) | RESPIRATORY_TRACT | Status: DC
  Administered 2022-01-03 – 2022-01-04 (×3): 1 mg via RESPIRATORY_TRACT
  Filled 2022-01-03 (×7): qty 4

## 2022-01-03 MED ORDER — DIPHENHYDRAMINE HCL 25 MG PO CAPS: 25.0000 mg | ORAL_CAPSULE | Freq: Four times a day (QID) | ORAL | Status: DC | PRN

## 2022-01-03 MED ORDER — METHYLERGONOVINE MALEATE 0.2 MG PO TABS: 0.2000 mg | ORAL_TABLET | ORAL | Status: DC | PRN

## 2022-01-03 MED ORDER — METHYLERGONOVINE MALEATE 0.2 MG/ML IJ SOLN
0.2000 mg | INTRAMUSCULAR | Status: DC | PRN
  Filled 2022-01-03: qty 1

## 2022-01-03 MED ORDER — COCONUT OIL OIL
1.0000 "application " | TOPICAL_OIL | Status: DC | PRN
  Administered 2022-01-03: 1 via TOPICAL
  Filled 2022-01-03: qty 120

## 2022-01-03 MED ORDER — PIPERACILLIN-TAZOBACTAM 3.375 G IVPB 30 MIN
3.3750 g | Freq: Once | INTRAVENOUS | Status: AC
  Administered 2022-01-03: 3.375 g via INTRAVENOUS
  Filled 2022-01-03: qty 50

## 2022-01-03 MED ORDER — BENZOCAINE-MENTHOL 20-0.5 % EX AERO
1.0000 "application " | INHALATION_SPRAY | CUTANEOUS | Status: DC | PRN
  Administered 2022-01-03: 1 via TOPICAL
  Filled 2022-01-03: qty 56

## 2022-01-03 MED ORDER — ZOLPIDEM TARTRATE 5 MG PO TABS: 5.0000 mg | ORAL_TABLET | Freq: Every evening | ORAL | Status: DC | PRN

## 2022-01-03 MED ORDER — ACETAMINOPHEN 500 MG PO TABS
1000.0000 mg | ORAL_TABLET | Freq: Four times a day (QID) | ORAL | Status: DC
  Administered 2022-01-03 – 2022-01-04 (×2): 1000 mg via ORAL
  Filled 2022-01-03 (×3): qty 2

## 2022-01-03 MED ORDER — ONDANSETRON HCL 4 MG PO TABS: 4.0000 mg | ORAL_TABLET | ORAL | Status: DC | PRN

## 2022-01-03 MED ORDER — MONTELUKAST SODIUM 10 MG PO TABS
10.0000 mg | ORAL_TABLET | Freq: Every day | ORAL | Status: DC
  Administered 2022-01-03: 10 mg via ORAL
  Filled 2022-01-03 (×2): qty 1

## 2022-01-03 NOTE — Lactation Note (Signed)
This note was copied from a baby's chart. Lactation Consultation Note  Patient Name: Renee Gonzalez'S Date: 01/03/2022 Reason for consult: Initial assessment;Difficult latch;Primapara;Late-preterm 34-36.6wks Age:37 hours  Maternal Data Has patient been taught Hand Expression?: Yes Does the patient have breastfeeding experience prior to this delivery?: No  Feeding Mother's Current Feeding Choice: Breast Milk Mom had recently fed baby on left breast, assisted with latching baby to right breast in right side lying position, breast firm, firm around areola, difficult to compress, nipples flat on both sides, mom shown how to apply 16 mm nipple shield, after few attempts baby able to latch to shield, but at times had difficulty coordinating suck, nursed 5 min and fell asleep at breast, noted colostrum in nipple shield, breasts still firm, softened sl with nursing, DEBP set up and mom pumped in initiation mode and obtained 1.4  cc colostrum, baby awake and rooting, this EBM was given to baby with syringe while baby was sucking on my gloved finger.   LATCH Score Latch: Repeated attempts needed to sustain latch, nipple held in mouth throughout feeding, stimulation needed to elicit sucking reflex.  Audible Swallowing: A few with stimulation  Type of Nipple: Flat  Comfort (Breast/Nipple): Filling, red/small blisters or bruises, mild/mod discomfort (breasts sl firm, tight around areola)  Hold (Positioning): Assistance needed to correctly position infant at breast and maintain latch.  LATCH Score: 5   Lactation Tools Discussed/Used Tools: Nipple Dorris Carnes;Pump Nipple shield size: 16 Breast pump type: Double-Electric Breast Pump Pump Education: Setup, frequency, and cleaning;Milk Storage Reason for Pumping: to soften breasts, evert nipples Pumping frequency: prn Pumped volume: 1.4 mL Recommend prepumping to evert nipples before placing nipple shield and pumping after nursing if breasts  still firm Medela nipple shield instruction sheet given Interventions Interventions: Breast feeding basics reviewed;Assisted with latch;Skin to skin;Hand express;Breast massage;Breast compression;Adjust position;Support pillows;Shells;DEBP;Education Breast shells implemented to evert nipples after bra placed on.   Lanolin )Purelan) sample packets given with instruction in use LC name and no written on white board Discharge Pump: Personal;DEBP WIC Program: No  Consult Status Consult Status: Follow-up Date: 01/03/22 Follow-up type: In-patient    Dyann Kief 01/03/2022, 12:13 PM

## 2022-01-03 NOTE — Progress Notes (Signed)
Subjective:  Doing well.  Ambulating and voiding without difficulty.  Appetite increasing. Bleeding normal.  Denies pain. LC was in to see and assist with nursing, infant able to nurse with NS.  Objective:  Vital signs in last 24 hours: Temp:  [98 F (36.7 C)-100.8 F (38.2 C)] 98.8 F (37.1 C) (02/03 0757) Pulse Rate:  [87-108] 106 (02/03 0757) Resp:  [18] 18 (02/03 0757) BP: (99-138)/(51-119) 114/68 (02/03 0757) SpO2:  [83 %-99 %] 97 % (02/03 0757)    General: NAD Breasts: soft, no redness or masses, nipples flat and intact bilaterally Pulmonary: no increased work of breathing Abdomen: non-distended, non-tender, fundus firm at level of umbilicus Perineum: minimal swelling  Lochia: light, Rubra  Extremities: no edema, no erythema, no tenderness  Results for orders placed or performed during the hospital encounter of 01/02/22 (from the past 72 hour(s))  Resp Panel by RT-PCR (Flu A&B, Covid) Nasopharyngeal Swab     Status: None   Collection Time: 01/02/22  9:19 AM   Specimen: Nasopharyngeal Swab; Nasopharyngeal(NP) swabs in vial transport medium  Result Value Ref Range   SARS Coronavirus 2 by RT PCR NEGATIVE NEGATIVE    Comment: (NOTE) SARS-CoV-2 target nucleic acids are NOT DETECTED.  The SARS-CoV-2 RNA is generally detectable in upper respiratory specimens during the acute phase of infection. The lowest concentration of SARS-CoV-2 viral copies this assay can detect is 138 copies/mL. A negative result does not preclude SARS-Cov-2 infection and should not be used as the sole basis for treatment or other patient management decisions. A negative result may occur with  improper specimen collection/handling, submission of specimen other than nasopharyngeal swab, presence of viral mutation(s) within the areas targeted by this assay, and inadequate number of viral copies(<138 copies/mL). A negative result must be combined with clinical observations, patient history, and  epidemiological information. The expected result is Negative.  Fact Sheet for Patients:  BloggerCourse.com  Fact Sheet for Healthcare Providers:  SeriousBroker.it  This test is no t yet approved or cleared by the [place] FDA and  has been authorized for detection and/or diagnosis of SARS-CoV-2 by FDA under an Emergency Use Authorization (EUA). This EUA will remain  in effect (meaning this test can be used) for the duration of the COVID-19 declaration under Section 564(b)(1) of the Act, 21 U.S.C.section 360bbb-3(b)(1), unless the authorization is terminated  or revoked sooner.       Influenza A by PCR NEGATIVE NEGATIVE   Influenza B by PCR NEGATIVE NEGATIVE    Comment: (NOTE) The Xpert Xpress SARS-CoV-2/FLU/RSV plus assay is intended as an aid in the diagnosis of influenza from Nasopharyngeal swab specimens and should not be used as a sole basis for treatment. Nasal washings and aspirates are unacceptable for Xpert Xpress SARS-CoV-2/FLU/RSV testing.  Fact Sheet for Patients: BloggerCourse.com  Fact Sheet for Healthcare Providers: SeriousBroker.it  This test is not yet approved or cleared by the [place] FDA and has been authorized for detection and/or diagnosis of SARS-CoV-2 by FDA under an Emergency Use Authorization (EUA). This EUA will remain in effect (meaning this test can be used) for the duration of the COVID-19 declaration under Section 564(b)(1) of the Act, 21 U.S.C. section 360bbb-3(b)(1), unless the authorization is terminated or revoked.  Performed at [facility], [address] [place], [place] [address]   CBC     Status: None   Collection Time: 01/02/22  9:41 AM  Result Value Ref Range   WBC 8.4 4.0 - 10.5 K/uL  RBC 4.09 3.87 - 5.11 MIL/uL   Hemoglobin 12.3 12.0 - 15.0 g/dL   HCT 16.1 09.6 - 04.5 %   MCV 89.7 80.0 - 100.0 fL   MCH  30.1 26.0 - 34.0 pg   MCHC 33.5 30.0 - 36.0 g/dL   RDW 40.9 81.1 - 91.4 %   Platelets 386 150 - 400 K/uL   nRBC 0.0 0.0 - 0.2 %    Comment: Performed at [facility], [address] [place], [place] [address]  Type and screen [facility]     Status: None   Collection Time: 01/02/22  9:41 AM  Result Value Ref Range   ABO/RH(D) O POS    Antibody Screen NEG    Sample Expiration      01/05/2022,2359 Performed at [facility], [address] [place], [place] [address]   HIV Antibody (routine testing w rflx)     Status: None   Collection Time: 01/02/22  9:41 AM  Result Value Ref Range   HIV Screen 4th Generation wRfx Non Reactive Non Reactive    Comment: Performed at [facility], [address] [place], [place] [address]  ABO/Rh     Status: None   Collection Time: 01/02/22 11:45 AM  Result Value Ref Range   ABO/RH(D)      O POS Performed at [facility], [address] [place], [place] [address]   Glucose, capillary     Status: None   Collection Time: 01/02/22 12:13 PM  Result Value Ref Range   Glucose-Capillary 79 70 - 99 mg/dL    Comment: Glucose reference range applies only to samples taken after fasting for at least 8 hours.   Comment 1 Notify RN   Glucose, capillary     Status: None   Collection Time: 01/02/22  2:11 PM  Result Value Ref Range   Glucose-Capillary 83 70 - 99 mg/dL    Comment: Glucose reference range applies only to samples taken after fasting for at least 8 hours.   Comment 1 Notify RN   Glucose, capillary     Status: None   Collection Time: 01/02/22  4:26 PM  Result Value Ref Range   Glucose-Capillary 97 70 - 99 mg/dL    Comment: Glucose reference range applies only to samples taken after fasting for at least 8 hours.   Comment 1 Notify RN   Glucose, capillary     Status: None   Collection Time: 01/02/22  7:20 PM  Result Value Ref Range   Glucose-Capillary 95 70 - 99 mg/dL    Comment: Glucose  reference range applies only to samples taken after fasting for at least 8 hours.  CBC     Status: Abnormal   Collection Time: 01/03/22  7:58 AM  Result Value Ref Range   WBC 12.5 (H) 4.0 - 10.5 K/uL   RBC 3.49 (L) 3.87 - 5.11 MIL/uL   Hemoglobin 10.5 (L) 12.0 - 15.0 g/dL   HCT 95.2 (L) 84.1 - 32.4 %   MCV 87.7 80.0 - 100.0 fL   MCH 30.1 26.0 - 34.0 pg   MCHC 34.3 30.0 - 36.0 g/dL   RDW 40.1 02.7 - 25.3 %   Platelets 343 150 - 400 K/uL   nRBC 0.0 0.0 - 0.2 %    Comment: Performed at [facility], [address], [place], [place] [address]    Assessment:   36 y.o. G1P0101 postpartum day # 1  Plan:  1) Acute blood loss anemia - hemodynamically stable and asymptomatic - po ferrous sulfate  2) Blood Type --/--/O POS Performed at [facility], [address] [place], [place] [address]  [phone](02/02 1145) / Rubella 7.86 (08/09 1532) / Varicella Immune  3) TDAP status up to date  4) Feeding plan breast, LC as needed  5)  Education given regarding options for contraception, as well as compatibility with breast feeding if applicable.  Patient plans on oral progesterone-only contraceptive for contraception.  6) Zosyn x 1 ordered given chorio and uterine manipulation during PPH.  7) disposition: discharge 2/4    Carie CaddyLydia Qianna Clagett, CNM  [facility], [facility] 01/03/2022, 10:38 AM

## 2022-01-03 NOTE — Lactation Note (Signed)
This note was copied from a baby's chart. Lactation Consultation Note  Patient Name: Renee Gonzalez GBTDV'V Date: 01/03/2022 Reason for consult: Follow-up assessment Age:37 hours  Maternal Data    Feeding Mother's Current Feeding Choice: Breast Milk Breastfed in right cradle hold with 16 mm nipple shield after prepump to evert nipple, consistently nursed with less stimulation in this position, 20 mm shield tried on left, mom feels she felt stronger suck on this side, fed 15 min in cradle hold, fell asleep and came off  breast   LATCH Score Latch: Grasps breast easily, tongue down, lips flanged, rhythmical sucking.  Audible Swallowing: A few with stimulation  Type of Nipple: Flat  Comfort (Breast/Nipple): Soft / non-tender  Hold (Positioning): Assistance needed to correctly position infant at breast and maintain latch.  LATCH Score: 7   Lactation Tools Discussed/Used Tools: Nipple Shields Nipple shield size: 20 Breast pump type: Double-Electric Breast Pump Reason for Pumping: to evert nipples  Interventions Interventions: Assisted with latch;Skin to skin;Pre-pump if needed;Adjust position;Support pillows;Coconut oil;Education  Discharge    Consult Status Consult Status: PRN Date: 01/04/22 Follow-up type: In-patient    Dyann Kief 01/03/2022, 3:50 PM

## 2022-01-03 NOTE — Anesthesia Postprocedure Evaluation (Signed)
Anesthesia Post Note  Patient: Renee Gonzalez  Procedure(s) Performed: AN AD HOC LABOR EPIDURAL  Patient location during evaluation: Nursing Unit Anesthesia Type: Epidural Level of consciousness: awake Pain management: pain level controlled Respiratory status: spontaneous breathing Cardiovascular status: stable Postop Assessment: no headache Anesthetic complications: no   No notable events documented.   Last Vitals:  Vitals:   01/03/22 0151 01/03/22 0309  BP: 118/80 114/72  Pulse: 88 87  Resp: 18 18  Temp: 36.7 C 37.2 C  SpO2: 98% 99%    Last Pain:  Vitals:   01/03/22 0545  TempSrc:   PainSc: 0-No pain                 Jaye Beagle

## 2022-01-03 NOTE — Progress Notes (Signed)
MD at bedside to assess bleeding. Jada device removed by MD at 0104.

## 2022-01-04 LAB — CULTURE, BETA STREP (GROUP B ONLY): Strep Gp B Culture: NEGATIVE

## 2022-01-07 ENCOUNTER — Encounter: Payer: BC Managed Care – PPO | Admitting: Licensed Practical Nurse

## 2022-01-07 LAB — SURGICAL PATHOLOGY

## 2022-01-17 ENCOUNTER — Ambulatory Visit: Payer: BC Managed Care – PPO | Admitting: Obstetrics and Gynecology

## 2022-01-30 ENCOUNTER — Other Ambulatory Visit: Payer: Self-pay

## 2022-01-30 ENCOUNTER — Ambulatory Visit (INDEPENDENT_AMBULATORY_CARE_PROVIDER_SITE_OTHER): Payer: BC Managed Care – PPO | Admitting: Obstetrics and Gynecology

## 2022-01-30 ENCOUNTER — Encounter: Payer: Self-pay | Admitting: Obstetrics and Gynecology

## 2022-01-30 DIAGNOSIS — Z30011 Encounter for initial prescription of contraceptive pills: Secondary | ICD-10-CM

## 2022-01-30 MED ORDER — NORETHIN ACE-ETH ESTRAD-FE 1-20 MG-MCG PO TABS
1.0000 | ORAL_TABLET | Freq: Every day | ORAL | 11 refills | Status: DC
Start: 2022-01-30 — End: 2022-02-28

## 2022-01-30 NOTE — Progress Notes (Signed)
? ?Postpartum Visit  ?Chief Complaint:  ?Chief Complaint  ?Patient presents with  ? Postpartum Care  ? ? ?History of Present Illness: Patient is a 36 y.o. G1P0101 presents for postpartum visit. ? ?Date of delivery: 01/02/2022 ?Type of delivery: Vaginal ?Laceration: Right labial and 1st degree laceration  ? ?Breast Feeding:  no ?Lochia: normal   ?Edinburgh Post-Partum Depression Score: 12  ?Date of last PAP: 2022  normal  ? ?She reports she has been feeling well. Infant is doing well. 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 Hemorrhoids were bothering her but have been better in the last 2 weeks.  ? ?Newborn Details:  ?SINGLETON :  ?1. Infant Status: Infant doing well at home with mother. ? ? ?Review of Systems: Review of Systems  ?Constitutional:  Negative for chills, fever, malaise/fatigue and weight loss.  ?HENT:  Negative for congestion, hearing loss and sinus pain.   ?Eyes:  Negative for blurred vision and double vision.  ?Respiratory:  Negative for cough, sputum production, shortness of breath and wheezing.   ?Cardiovascular:  Negative for chest pain, palpitations, orthopnea and leg swelling.  ?Gastrointestinal:  Negative for abdominal pain, constipation, diarrhea, nausea and vomiting.  ?Genitourinary:  Negative for dysuria, flank pain, frequency, hematuria and urgency.  ?Musculoskeletal:  Negative for back pain, falls and joint pain.  ?Skin:  Negative for itching and rash.  ?Neurological:  Negative for dizziness and headaches.  ?Psychiatric/Behavioral:  Negative for depression, substance abuse and suicidal ideas. The patient is not nervous/anxious.   ? ?Past Medical History:  ?Past Medical History:  ?Diagnosis Date  ? Asthma   ? Gestational diabetes   ? ? ?Past Surgical History:  ?Past Surgical History:  ?Procedure Laterality Date  ? SHOULDER SURGERY Right   ? ? ?Family History:  ?Family History  ?Problem Relation Age of Onset  ? Diabetes Mother   ? Diabetes Mellitus II Mother   ? Asthma Father   ?  Parkinson's disease Maternal Grandmother   ? Diabetes Paternal Grandmother   ? Asthma Other   ? ? ?Social History:  ?Social History  ? ?Socioeconomic History  ? Marital status: Married  ?  Spouse name: Wilson Singer  ? Number of children: Not on file  ? Years of education: Not on file  ? Highest education level: Not on file  ?Occupational History  ? 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 Smoking status: Never  ? Smokeless tobacco: Never  ?Vaping Use  ? Vaping Use: Never used  ?Substance and Sexual Activity  ? Alcohol use: Not Currently  ? Drug use: Never  ? Sexual activity: Yes  ?  Birth control/protection: Pill  ?Other Topics Concern  ? Not on file  ?Social History Narrative  ? Not on file  ? ?Social Determinants of Health  ? ?Financial Resource Strain: Not on file  ?Food Insecurity: Not on file  ?Transportation Needs: Not on file  ?Physical Activity: Not on file  ?Stress: Not on file  ?Social Connections: Not on file  ?Intimate Partner Violence: Not on file  ? ? ?Allergies:  ?Allergies  ?Allergen Reactions  ? Cat Hair Extract Shortness Of Breath  ?  Face & Throat swelling  ? Grass Extracts [Gramineae Pollens]   ? ? ?Medications: ?Prior to Admission medications   ?Medication Sig Start Date End Date Taking? Authorizing Provider  ?albuterol (PROVENTIL HFA;VENTOLIN HFA) 108 (90 Base) MCG/ACT inhaler 2 puffs every 4-6 hours prn wheezing, cough 12/15/16  Yes Payton Mccallum, MD  ?albuterol (  PROVENTIL) (2.5 MG/3ML) 0.083% nebulizer solution Take 3 mLs (2.5 mg total) by nebulization every 4 (four) hours as needed for wheezing or shortness of breath. 10/18/18  Yes Pyreddy, Vivien Rota, MD  ?FLOVENT HFA 220 MCG/ACT inhaler Inhale 2 puffs into the lungs 2 (two) times daily. 08/20/21  Yes [provider]  ?fluticasone (FLONASE) 50 MCG/ACT nasal spray Place 2 sprays into both nostrils daily. 04/02/21  Yes [provider]  ?[place] Lanius (OMEGA-3) 500 MG CAPS Take 1 capsule by mouth daily.   Yes [provider]   ?montelukast (SINGULAIR) 10 MG tablet Take 10 mg by mouth at bedtime.   Yes [provider]  ?norethindrone-ethinyl estradiol-FE (JUNEL FE 1/20) 1-20 MG-MCG tablet Take 1 tablet by mouth daily. 01/30/22  Yes Adrain Butrick, Jaquelyn Bitter, MD  ?Prenatal Vit-Fe Fumarate-FA (M-NATAL PLUS) 27-1 MG TABS Take 1 tablet by mouth daily. 06/05/21  Yes [provider]  ?vitamin B-12 (CYANOCOBALAMIN) 500 MCG tablet Take 500 mcg by mouth daily.   Yes [provider]  ?vitamin E 180 MG (400 UNITS) capsule Take 400 Units by mouth daily.   Yes [provider]  ? ? ?Physical Exam ?Vitals:  ?Vitals:  ? 01/30/22 0959  ?BP: 118/70  ? ? ?Physical Exam ?Constitutional:   ?   Appearance: Normal appearance. She is well-developed.  ?HENT:  ?   Head: Normocephalic and atraumatic.  ?Eyes:  ?   Extraocular Movements: Extraocular movements intact.  ?   Pupils: Pupils are equal, round, and reactive to light.  ?Neck:  ?   Thyroid: No thyromegaly.  ?Cardiovascular:  ?   Rate and Rhythm: Normal rate and regular rhythm.  ?   Heart sounds: Normal heart sounds.  ?Pulmonary:  ?   Effort: Pulmonary effort is normal.  ?   Breath sounds: Normal breath sounds.  ?Abdominal:  ?   General: Bowel sounds are normal. There is no distension.  ?   Palpations: Abdomen is soft. There is no mass.  ?Musculoskeletal:  ?   Cervical back: Neck supple.  ?Neurological:  ?   Mental Status: She is alert and oriented to person, place, and time.  ?Skin: ?   General: Skin is warm and dry.  ?Psychiatric:     ?   Behavior: Behavior normal.     ?   Thought Content: Thought content normal.     ?   Judgment: Judgment normal.  ?Vitals reviewed.  ? ? ?Assessment: 36 y.o. G1P0101 presenting for 6 week postpartum visit ? ?Plan: ?Problem List Items Addressed This Visit   ?None ?Visit Diagnoses   ? ? Postpartum care and examination    -  Primary  ? Encounter for BCP (birth control pills) initial prescription      ? Relevant Medications  ? norethindrone-ethinyl  estradiol-FE (JUNEL FE 1/20) 1-20 MG-MCG tablet  ? ?  ? ? ?1) Contraception-  OCP- rx sent, start after 6 weeks postpartum ? ?2)  Pap: up to date ? ?3) Patient underwent screening for postpartum depression with some concerns noted. ? ? ?- Follow up in 2-4 weeks  ? ?[name] MD, FACOG ?[facility], [facility] ?01/30/2022 ?10:23 AM ? ?

## 2022-02-07 IMAGING — US US PELVIS COMPLETE WITH TRANSVAGINAL
1 series · 14 of 25 positions shown · non-contrast
Comparison: None

CLINICAL DATA: Right-sided pelvic pain for 2 months.

EXAM:
TRANSABDOMINAL AND TRANSVAGINAL ULTRASOUND OF PELVIS
TECHNIQUE: Both transabdominal and transvaginal ultrasound examinations of the
pelvis were performed. Transabdominal technique was performed for
global imaging of the pelvis including uterus, ovaries, adnexal
regions, and pelvic cul-de-sac. It was necessary to proceed with
endovaginal exam following the transabdominal exam to visualize the
uterus, endometrium and ovaries to better advantage.

[Series 1: us pelvic complete with transvaginal · 14 of 99 slices shown]
[im 1/99]
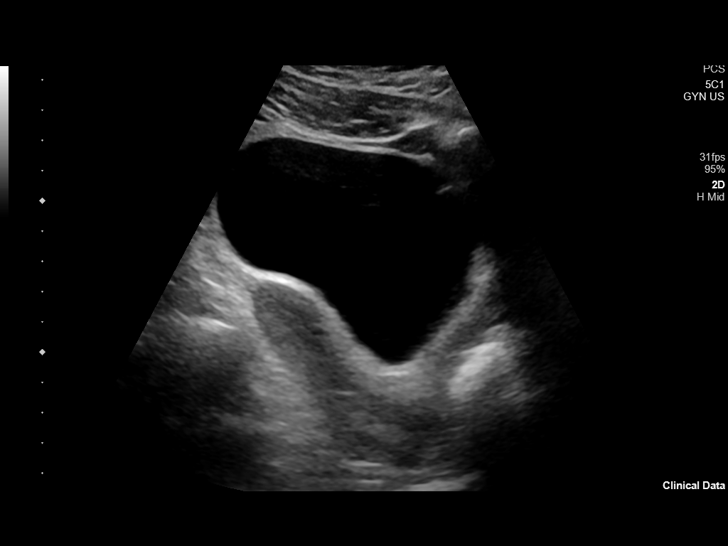
[im 9/99]
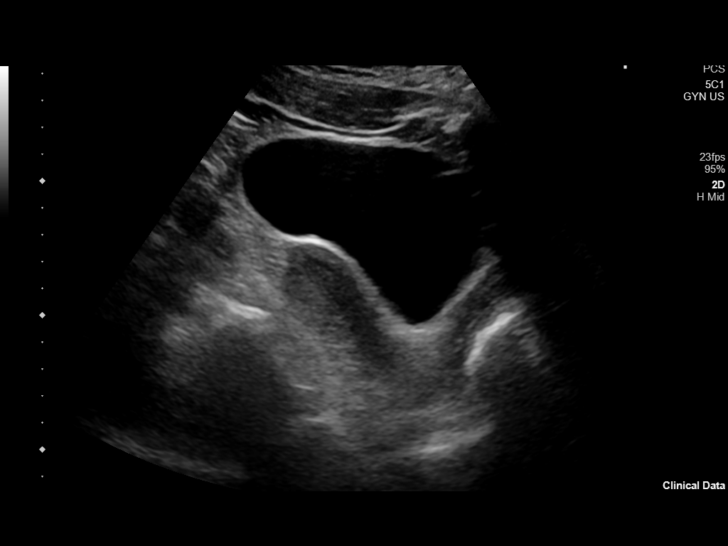
[im 17/99]
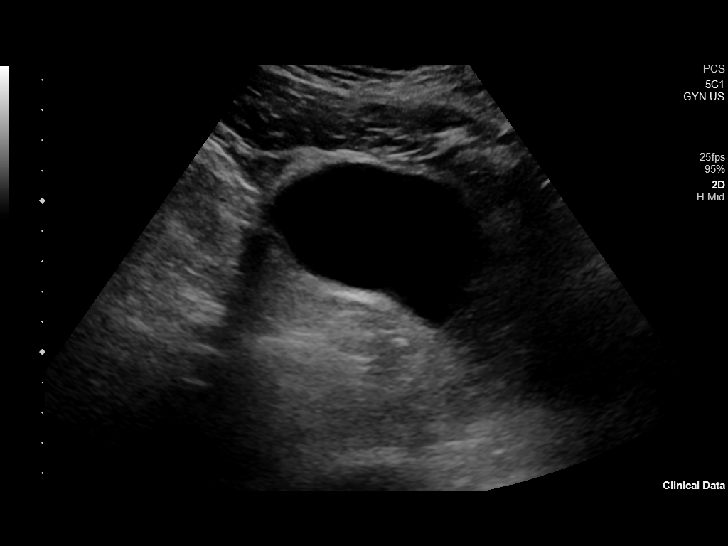
[im 25/99]
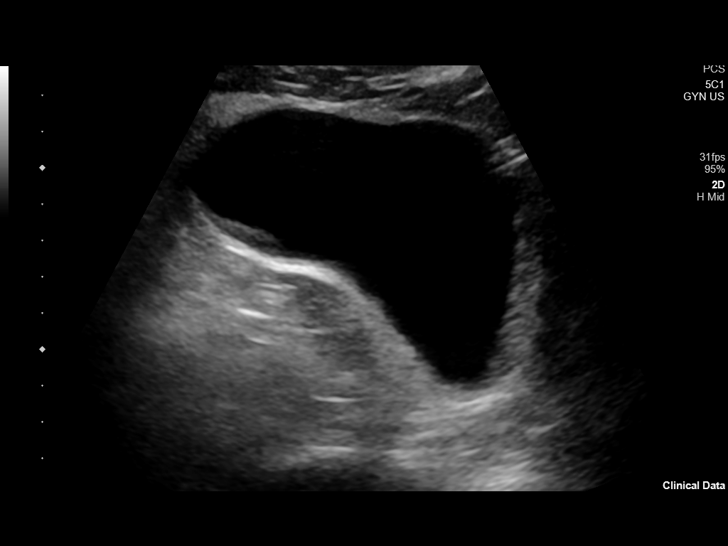
[im 33/99]
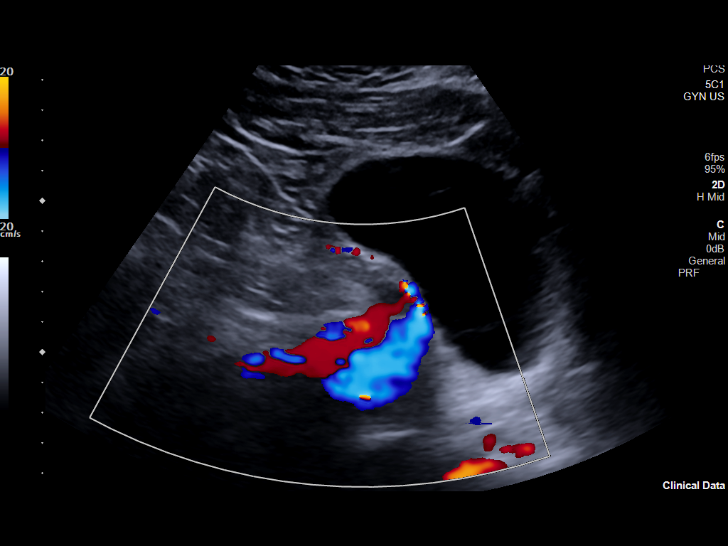
[im 37/99]
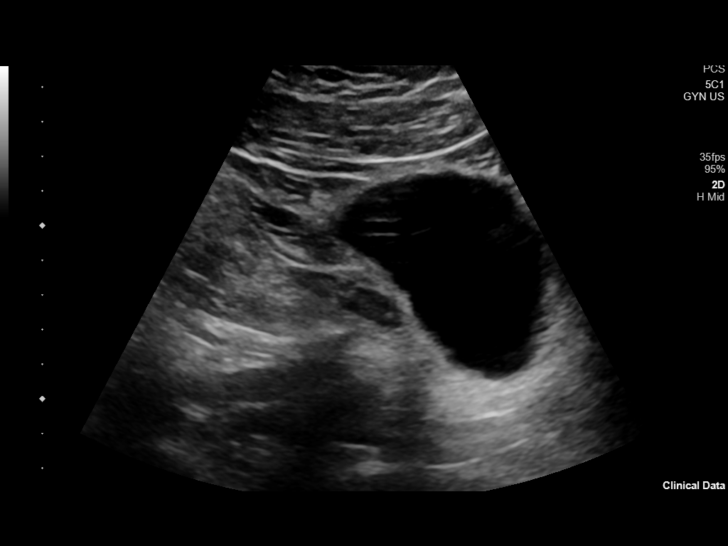
[im 45/99]
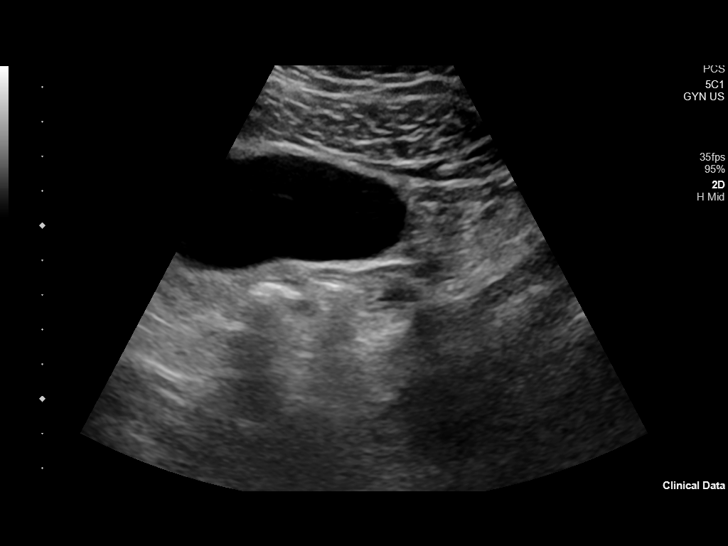
[im 54/99]
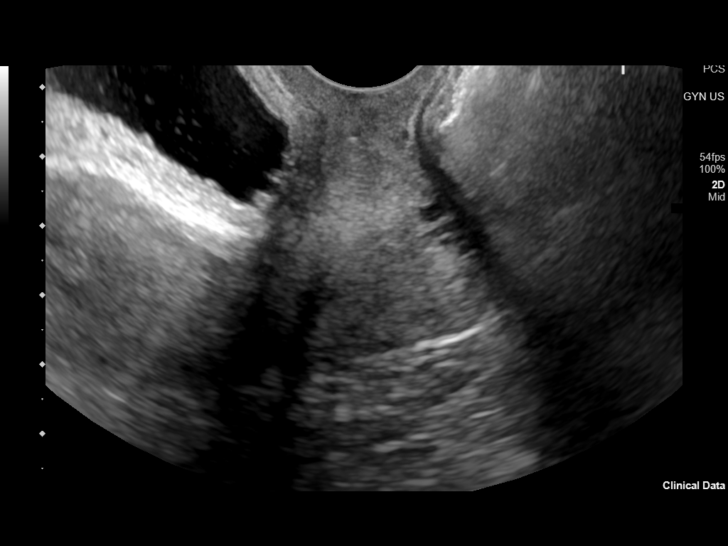
[im 62/99]
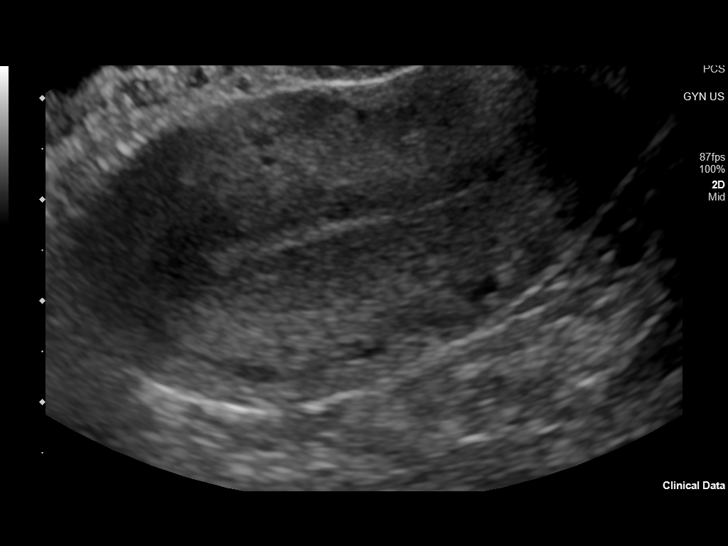
[im 66/99]
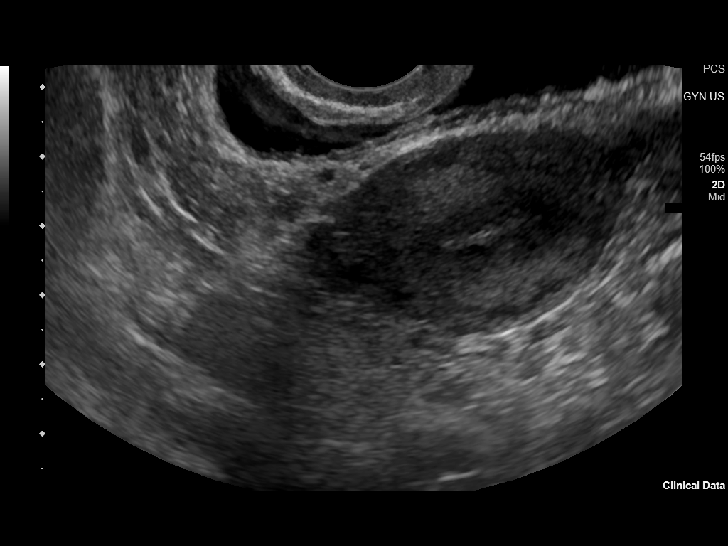
[im 74/99]
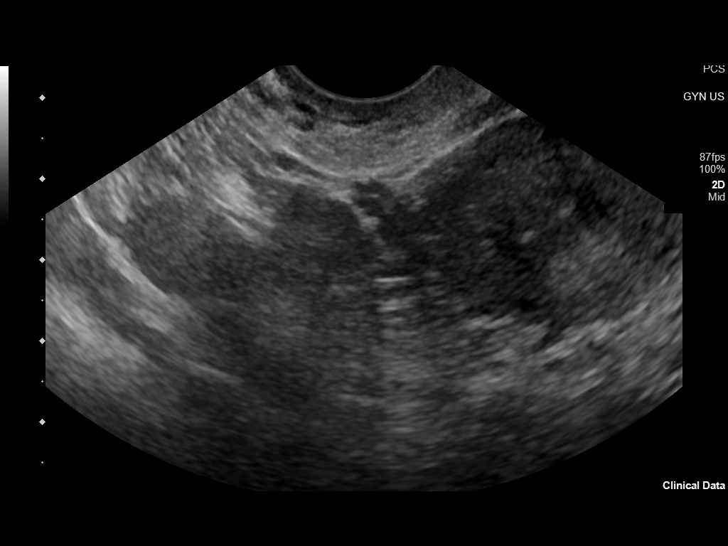
[im 82/99]
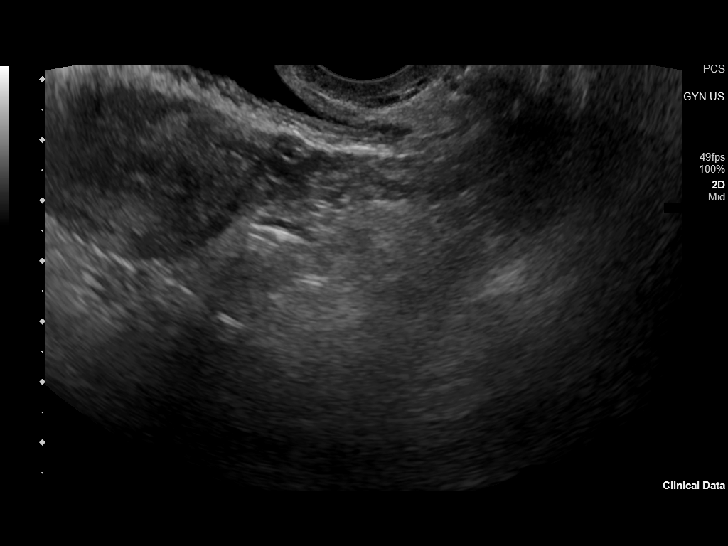
[im 90/99]
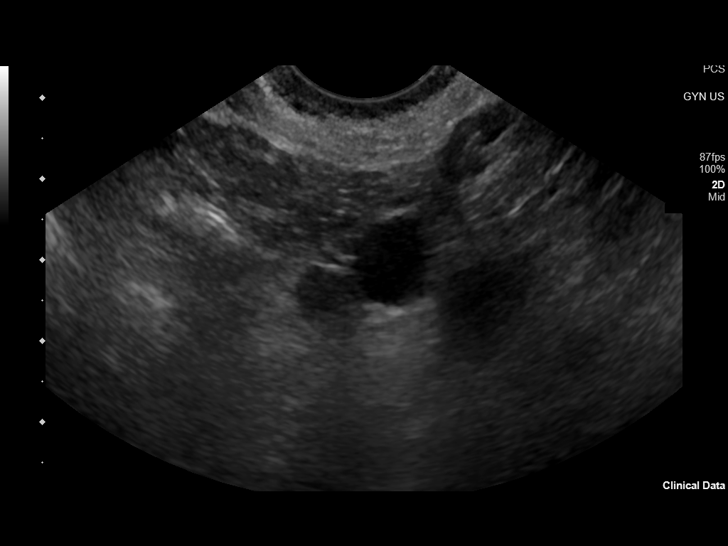
[im 99/99]
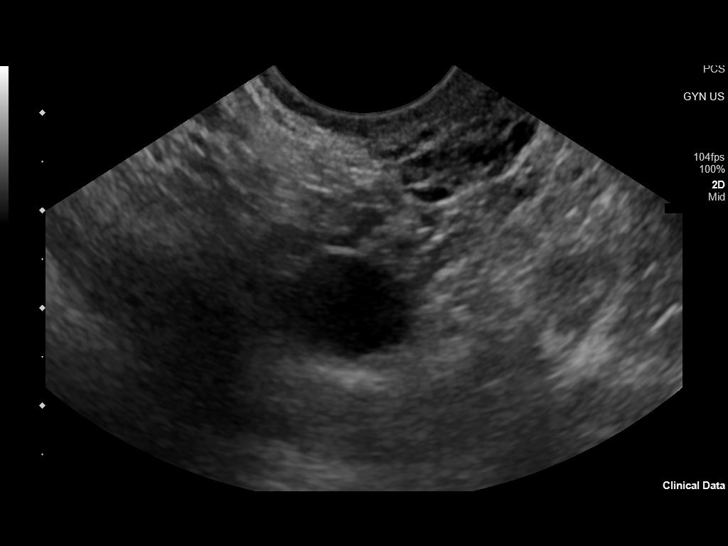

[14 of 25 positions shown; findings below may reference images not displayed]

FINDINGS: Uterus

Measurements: 8.1 x 3.1 x 4.2 cm = volume: 53.77 mL. No fibroids or
other mass visualized.

Endometrium

Thickness: 11 mm.  No focal abnormality visualized.

Right ovary

Measurements: 3.1 x 1.6 x 1.4 cm = volume: 3.67 mL. Normal
appearance/no adnexal mass.

Left ovary

Measurements: 0.9 x 0.9 x 1.9 cm = volume: 0.7 mL. Exophytic cyst
measures 1.5 x 0.9 x 1.1 cm. No complicating features. No adnexal
masses.

Other findings

No abnormal free fluid.
IMPRESSION: 1. Normal transabdominal and endovaginal pelvic ultrasound.

## 2022-02-20 ENCOUNTER — Ambulatory Visit (INDEPENDENT_AMBULATORY_CARE_PROVIDER_SITE_OTHER): Payer: BC Managed Care – PPO | Admitting: Obstetrics and Gynecology

## 2022-02-20 ENCOUNTER — Encounter: Payer: Self-pay | Admitting: Obstetrics and Gynecology

## 2022-02-20 ENCOUNTER — Other Ambulatory Visit: Payer: Self-pay

## 2022-02-20 NOTE — Progress Notes (Signed)
? ?Postpartum Visit  ?Chief Complaint:  ?Chief Complaint  ?Patient presents with  ? Postpartum Care  ?  6 wk  ? ? ?History of Present Illness: Patient is a 36 y.o. G1P0101 presents for postpartum visit. ? ?Date of delivery: 01/02/2022 ?Type of delivery: Vaginal ?Laceration: right labial and 1st degree  ? ?Breast Feeding:  yes ?Lochia: normal   ?Edinburgh Post-Partum Depression Score: 7  ?Date of last PAP: 2022 normal  ? ?She reports she has been feeling well. Infant is doing well.  ? ?Newborn Details:  ?SINGLETON :  ?1. Infant Status: Infant doing well at home with mother. ? ? ?Review of Systems: ROS ? ?Past Medical History:  ?Past Medical History:  ?Diagnosis Date  ? Asthma   ? Gestational diabetes   ? ? ?Past Surgical History:  ?Past Surgical History:  ?Procedure Laterality Date  ? SHOULDER SURGERY Right   ? ? ?Family History:  ?Family History  ?Problem Relation Age of Onset  ? Diabetes Mother   ? Diabetes Mellitus II Mother   ? Asthma Father   ? Parkinson's disease Maternal Grandmother   ? Diabetes Paternal Grandmother   ? Asthma Other   ? ? ?Social History:  ?Social History  ? ?Socioeconomic History  ? Marital status: Married  ?  Spouse name: Wilson Singer  ? Number of children: Not on file  ? Years of education: Not on file  ? Highest education level: Not on file  ?Occupational History  ? 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 Smoking status: Never  ? Smokeless tobacco: Never  ?Vaping Use  ? Vaping Use: Never used  ?Substance and Sexual Activity  ? Alcohol use: Not Currently  ? Drug use: Never  ? Sexual activity: Yes  ?  Birth control/protection: Pill  ?Other Topics Concern  ? Not on file  ?Social History Narrative  ? Not on file  ? ?Social Determinants of Health  ? ?Financial Resource Strain: Not on file  ?Food Insecurity: Not on file  ?Transportation Needs: Not on file  ?Physical Activity: Not on file  ?Stress: Not on file  ?Social Connections: Not on file  ?Intimate Partner Violence: Not on file   ? ? ?Allergies:  ?Allergies  ?Allergen Reactions  ? Cat Hair Extract Shortness Of Breath  ?  Face & Throat swelling  ? Grass Extracts [Gramineae Pollens]   ? ? ?Medications: ?Prior to Admission medications   ?Medication Sig Start Date End Date Taking? Authorizing Provider  ?albuterol (PROVENTIL HFA;VENTOLIN HFA) 108 (90 Base) MCG/ACT inhaler 2 puffs every 4-6 hours prn wheezing, cough 12/15/16  Yes Conty, Orlando, MD  ?albuterol (PROVENTIL) (2.5 MG/3ML) 0.083% nebulizer solution Take 3 mLs (2.5 mg total) by nebulization every 4 (four) hours as needed for wheezing or shortness of breath. 10/18/18  Yes Pyreddy, Vivien Rota, MD  ?FLOVENT HFA 220 MCG/ACT inhaler Inhale 2 puffs into the lungs 2 (two) times daily. 08/20/21  Yes [provider]  ?fluticasone (FLONASE) 50 MCG/ACT nasal spray Place 2 sprays into both nostrils daily. 04/02/21  Yes [provider]  ?Providence Lanius (OMEGA-3) 500 MG CAPS Take 1 capsule by mouth daily.   Yes [provider]  ?montelukast (SINGULAIR) 10 MG tablet Take 10 mg by mouth at bedtime.   Yes [provider]  ?Prenatal Vit-Fe Fumarate-FA (M-NATAL PLUS) 27-1 MG TABS Take 1 tablet by mouth daily. 06/05/21  Yes [provider]  ?vitamin B-12 (CYANOCOBALAMIN) 500 MCG tablet Take 500 mcg by mouth daily.   Yes  [provider]  ?vitamin E 180 MG (400 UNITS) capsule Take 400 Units by mouth daily.   Yes [provider]  ?norethindrone-ethinyl estradiol-FE (JUNEL FE 1/20) 1-20 MG-MCG tablet Take 1 tablet by mouth daily. ?Patient not taking: Reported on 02/20/2022 01/30/22   Natale Milch, MD  ? ? ?Physical Exam ?Vitals:  ?Vitals:  ? 02/20/22 1009  ?BP: 112/68  ?Pulse: 80  ? ? ?Physical Exam ?Constitutional:   ?   Appearance: She is well-developed.  ?Genitourinary:  ?   Genitourinary Comments: External: Normal appearing vulva. No lesions noted. Well healed lacerations  ?HENT:  ?   Head: Normocephalic and atraumatic.  ?Neck:  ?   Thyroid: No  thyromegaly.  ?Cardiovascular:  ?   Rate and Rhythm: Normal rate and regular rhythm.  ?   Heart sounds: Normal heart sounds.  ?Pulmonary:  ?   Effort: Pulmonary effort is normal.  ?   Breath sounds: Normal breath sounds.  ?Abdominal:  ?   General: Bowel sounds are normal. There is no distension.  ?   Palpations: Abdomen is soft. There is no mass.  ?Musculoskeletal:  ?   Cervical back: Neck supple.  ?Neurological:  ?   Mental Status: She is alert and oriented to person, place, and time.  ?Skin: ?   General: Skin is warm and dry.  ?Psychiatric:     ?   Behavior: Behavior normal.     ?   Thought Content: Thought content normal.     ?   Judgment: Judgment normal.  ?Vitals reviewed.  ? ? ?Assessment: 36 y.o. G1P0101 presenting for 6 week postpartum visit ? ?Plan: ?Problem List Items Addressed This Visit   ?None ?Visit Diagnoses   ? ? Postpartum care and examination    -  Primary  ? ?  ? ? ?1) Contraception-  OCP, has rx, will start soon ? ?2)  Pap: up to date ? ?3) Patient underwent screening for postpartum depression with no concerns noted. ? ?4) Gestational diabetes follow up- has continued to check sugars at home and reports normal values. Discussed screening for diabetes every 3 years long term.  ? ?- Follow up as needed  ? ?[name] MD, FACOG ?[facility], [facility] ?02/20/2022 ?10:55 AM ? ?

## 2022-02-28 ENCOUNTER — Ambulatory Visit: Payer: BC Managed Care – PPO | Admitting: Obstetrics and Gynecology

## 2022-02-28 ENCOUNTER — Encounter: Payer: Self-pay | Admitting: Obstetrics and Gynecology

## 2022-02-28 VITALS — BP 114/70 | Ht 63.0 in | Wt 166.0 lb

## 2022-02-28 DIAGNOSIS — Z30430 Encounter for insertion of intrauterine contraceptive device: Secondary | ICD-10-CM | POA: Diagnosis not present

## 2022-02-28 NOTE — Progress Notes (Signed)
? ?Patient ID: Renee Gonzalez, female   DOB: 08-12-1986, 36 y.o.   MRN: [id number] ? ?Reason for Consult: Contraception (Would like to switch to a different BC method due to low milk supply) ?  ?Referred by Abram Sander, MD ? ?Subjective:  ?   ?HPI: ? ?Renee Gonzalez is a 36 y.o. female. She took 5 days of an OCP but noticed a decrease in her milk supply. She would like to change to a progesterone only method. She desires a Mirena IUD. ? ?Past Medical History:  ?Diagnosis Date  ? Asthma   ? Gestational diabetes   ? ?Family History  ?Problem Relation Age of Onset  ? Diabetes Mother   ? Diabetes Mellitus II Mother   ? Asthma Father   ? Parkinson's disease Maternal Grandmother   ? Diabetes Paternal Grandmother   ? Asthma Other   ? ?Past Surgical History:  ?Procedure Laterality Date  ? SHOULDER SURGERY Right   ? ? ?Short Social History:  ?Social History  ? ?Tobacco Use  ? Smoking status: Never  ? Smokeless tobacco: Never  ?Substance Use Topics  ? Alcohol use: Not Currently  ? ? ?Allergies  ?Allergen Reactions  ? Cat Hair Extract Shortness Of Breath  ?  Face & Throat swelling  ? Grass Extracts [Gramineae Pollens]   ? ? ?Current Outpatient Medications  ?Medication Sig Dispense Refill  ? albuterol (PROVENTIL HFA;VENTOLIN HFA) 108 (90 Base) MCG/ACT inhaler 2 puffs every 4-6 hours prn wheezing, cough 1 Inhaler 0  ? albuterol (PROVENTIL) (2.5 MG/3ML) 0.083% nebulizer solution Take 3 mLs (2.5 mg total) by nebulization every 4 (four) hours as needed for wheezing or shortness of breath. 75 mL 0  ? FLOVENT HFA 220 MCG/ACT inhaler Inhale 2 puffs into the lungs 2 (two) times daily.    ? fluticasone (FLONASE) 50 MCG/ACT nasal spray Place 2 sprays into both nostrils daily.    ? Krill Oil (OMEGA-3) 500 MG CAPS Take 1 capsule by mouth daily.    ? montelukast (SINGULAIR) 10 MG tablet Take 10 mg by mouth at bedtime.    ? Prenatal Vit-Fe Fumarate-FA (M-NATAL PLUS) 27-1 MG TABS Take 1 tablet by mouth daily.    ? vitamin B-12 (CYANOCOBALAMIN)  500 MCG tablet Take 500 mcg by mouth daily.    ? vitamin E 180 MG (400 UNITS) capsule Take 400 Units by mouth daily.    ? norethindrone-ethinyl estradiol-FE (JUNEL FE 1/20) 1-20 MG-MCG tablet Take 1 tablet by mouth daily. (Patient not taking: Reported on 02/20/2022) 28 tablet 11  ? ?No current facility-administered medications for this visit.  ? ? ?Review of Systems  ?Constitutional: Negative for chills, fatigue, fever and unexpected weight change.  ?HENT: Negative for trouble swallowing.  ?Eyes: Negative for loss of vision.  ?Respiratory: Negative for cough, shortness of breath and wheezing.  ?Cardiovascular: Negative for chest pain, leg swelling, palpitations and syncope.  ?GI: Negative for abdominal pain, blood in stool, diarrhea, nausea and vomiting.  ?GU: Negative for difficulty urinating, dysuria, frequency and hematuria.  ?Musculoskeletal: Negative for back pain, leg pain and joint pain.  ?Skin: Negative for rash.  ?Neurological: Negative for dizziness, headaches, light-headedness, numbness and seizures.  ?Psychiatric: Negative for behavioral problem, confusion, depressed mood and sleep disturbance.   ? ?   ?Objective:  ?Objective  ? ?Vitals:  ? 02/28/22 0857  ?BP: 114/70  ?Weight: 166 lb (75.3 kg)  ?Height: 5\' 3"  (1.6 m)  ? ?Body mass index is 29.41 kg/m?. ? ?Physical Exam ?Vitals  and nursing note reviewed. Exam conducted with a chaperone present.  ?Constitutional:   ?   Appearance: Normal appearance. She is well-developed.  ?HENT:  ?   Head: Normocephalic and atraumatic.  ?Eyes:  ?   Extraocular Movements: Extraocular movements intact.  ?   Pupils: Pupils are equal, round, and reactive to light.  ?Cardiovascular:  ?   Rate and Rhythm: Normal rate and regular rhythm.  ?Pulmonary:  ?   Effort: Pulmonary effort is normal. No respiratory distress.  ?   Breath sounds: Normal breath sounds.  ?Abdominal:  ?   General: Abdomen is flat.  ?   Palpations: Abdomen is soft.  ?Genitourinary: ?   Comments: External: Normal  appearing vulva. No lesions noted.  ?Speculum examination: Normal appearing cervix. Moderate blood in the vaginal vault. No discharge.   ?Musculoskeletal:     ?   General: No signs of injury.  ?Skin: ?   General: Skin is warm and dry.  ?Neurological:  ?   Mental Status: She is alert and oriented to person, place, and time.  ?Psychiatric:     ?   Behavior: Behavior normal.     ?   Thought Content: Thought content normal.     ?   Judgment: Judgment normal.  ? ? ? ?IUD PROCEDURE NOTE: ? ?[name] is a 36 y.o. G1P0101 here for IUD insertion. No GYN concerns. Pregnancy excluded: on menses ? ?IUD Insertion Procedure Note ?Patient identified, informed consent performed, consent signed.   Discussed risks of irregular bleeding, cramping, infection, malpositioning or misplacement of the IUD outside the uterus which may require further procedure such as laparoscopy, risk of failure <1%. Time out was performed.   ? ?A bimanual exam showed the uterus to be anteverted.  Speculum placed in the vagina.  Cervix visualized.  Cleaned with Betadine x 2.  Grasped anteriorly with a single tooth tenaculum.  Uterus sounded to 8 cm.   Mirena IUD placed per manufacturer's recommendations.  Strings trimmed to 3 cm. Tenaculum was removed, good hemostasis noted.  Patient tolerated procedure well.  ? ?Patient was given post-procedure instructions.  She was advised to have backup contraception for one week.  Patient was also asked to check IUD strings periodically and follow up in 4 weeks for IUD check. ? ? ?Assessment/Plan:  ?  ? ?36 yo here to discuss contraception options with breast feeding other than OCP ?Reviewed options for progesterone only methods and non-hormonal options. She desires a Mirena IUD ?Mirena IUD inserted without issue.  ? ?Follow up in 4 weeks for a string check ? ?More than 20 minutes were spent face to face with the patient in the room, reviewing the medical record, labs and images, and coordinating care for the  patient. The plan of management was discussed in detail and counseling was provided.  ?  ?[name] MD ?[facility], [facility] ?02/28/2022 ?9:19 AM ? ? ?

## 2022-02-28 NOTE — Addendum Note (Signed)
Addended by: Drenda Freeze on: 02/28/2022 09:36 AM ? ? Modules accepted: Orders ? ?

## 2022-03-28 ENCOUNTER — Ambulatory Visit (INDEPENDENT_AMBULATORY_CARE_PROVIDER_SITE_OTHER): Payer: BC Managed Care – PPO | Admitting: Licensed Practical Nurse

## 2022-03-28 ENCOUNTER — Ambulatory Visit: Payer: BC Managed Care – PPO | Admitting: Licensed Practical Nurse

## 2022-03-28 VITALS — BP 122/70 | Ht 63.0 in | Wt 171.0 lb

## 2022-03-28 DIAGNOSIS — Z30431 Encounter for routine checking of intrauterine contraceptive device: Secondary | ICD-10-CM

## 2022-03-28 NOTE — Progress Notes (Signed)
? ? ? ?Obstetrics & Gynecology Office Visit  ? ?Chief Complaint: No chief complaint on file. ? ? ?History of Present Illness: 36 y.o. patient presenting for follow up of Mirena IUD placement 4 weeks ago.  The indication for her IUD was contraception.  She denies any complications since her IUD placement.  Still having some occasional spotting.  Has not checked her strings.   ?Is not sure about a future pregnancy. Has had IC without any concerns.  ? ?Review of Systems: Review of Systems  ?Constitutional: Negative.   ?Gastrointestinal: Negative.   ?Genitourinary: Negative.   ? ?Past Medical History:  ?Past Medical History:  ?Diagnosis Date  ? Asthma   ? Gestational diabetes   ? ? ?Past Surgical History:  ?Past Surgical History:  ?Procedure Laterality Date  ? SHOULDER SURGERY Right   ? ? ?Gynecologic History: No LMP recorded. (Menstrual status: Irregular Periods). ? ?Obstetric History: G1P0101 ? ?Family History:  ?Family History  ?Problem Relation Age of Onset  ? Diabetes Mother   ? Diabetes Mellitus II Mother   ? Asthma Father   ? Parkinson's disease Maternal Grandmother   ? Diabetes Paternal Grandmother   ? Asthma Other   ? ? ?Social History:  ?Social History  ? ?Socioeconomic History  ? Marital status: Married  ?  Spouse name: Wilson Singer  ? Number of children: Not on file  ? Years of education: Not on file  ? Highest education level: Not on file  ?Occupational History  ? 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 Smoking status: Never  ? Smokeless tobacco: Never  ?Vaping Use  ? Vaping Use: Never used  ?Substance and Sexual Activity  ? Alcohol use: Not Currently  ? Drug use: Never  ? Sexual activity: Yes  ?  Birth control/protection: None  ?Other Topics Concern  ? Not on file  ?Social History Narrative  ? Not on file  ? ?Social Determinants of Health  ? ?Financial Resource Strain: Not on file  ?Food Insecurity: Not on file  ?Transportation Needs: Not on file  ?Physical Activity: Not on file  ?Stress: Not on file   ?Social Connections: Not on file  ?Intimate Partner Violence: Not on file  ? ? ?Allergies:  ?Allergies  ?Allergen Reactions  ? Cat Hair Extract Shortness Of Breath  ?  Face & Throat swelling  ? Grass Extracts [Gramineae Pollens]   ? ? ?Medications: ?Prior to Admission medications   ?Medication Sig Start Date End Date Taking? Authorizing Provider  ?albuterol (PROVENTIL HFA;VENTOLIN HFA) 108 (90 Base) MCG/ACT inhaler 2 puffs every 4-6 hours prn wheezing, cough 12/15/16  Yes Conty, Orlando, MD  ?albuterol (PROVENTIL) (2.5 MG/3ML) 0.083% nebulizer solution Take 3 mLs (2.5 mg total) by nebulization every 4 (four) hours as needed for wheezing or shortness of breath. 10/18/18  Yes Pyreddy, Vivien Rota, MD  ?FLOVENT HFA 220 MCG/ACT inhaler Inhale 2 puffs into the lungs 2 (two) times daily. 08/20/21  Yes [provider]  ?fluticasone (FLONASE) 50 MCG/ACT nasal spray Place 2 sprays into both nostrils daily. 04/02/21  Yes [provider]  ?Providence Lanius (OMEGA-3) 500 MG CAPS Take 1 capsule by mouth daily.   Yes [provider]  ?montelukast (SINGULAIR) 10 MG tablet Take 10 mg by mouth at bedtime.   Yes [provider]  ?Prenatal Vit-Fe Fumarate-FA (M-NATAL PLUS) 27-1 MG TABS Take 1 tablet by mouth daily. 06/05/21  Yes [provider]  ?vitamin B-12 (CYANOCOBALAMIN) 500 MCG tablet Take 500 mcg by mouth daily.  Yes [provider]  ?vitamin E 180 MG (400 UNITS) capsule Take 400 Units by mouth daily.   Yes [provider]  ?levonorgestrel (MIRENA) 20 MCG/DAY IUD 1 each by Intrauterine route once. 02/28/22 02/28/22  [provider]  ? ? ?Physical Exam ?Blood pressure 122/70, height 5\' 3"  (1.6 m), weight 171 lb (77.6 kg), currently breastfeeding. ?No LMP recorded. (Menstrual status: Irregular Periods). ? ?General: NAD ?HEENT: normocephalic, anicteric ?Pulmonary: No increased work of breathing ? ?Genitourinary: ? External: Normal external female genitalia.  Normal urethral  meatus, normal  Bartholin's and Skene's glands.   ? Vagina: Normal vaginal mucosa, no evidence of prolapse.   ? Cervix: Grossly normal in appearance, no bleeding, IUD strings visualized 2cm ? Lymphatic: no evidence of inguinal lymphadenopathy ?Extremities: no edema, erythema, or tenderness ?Neurologic: Grossly intact ?Psychiatric: mood appropriate, affect full ? ? ? ?Assessment: 36 y.o. G1P0101 No problem-specific Assessment & Plan notes found for this encounter. ?IUD present  ? ?Plan: ?Problem List Items Addressed This Visit   ?None ? ? ? ?1.  The patient was given instructions to check her IUD strings monthly and call with any problems or concerns.  She should call for fevers, chills, abnormal vaginal discharge, pelvic pain, or other complaints. ? ?2.   IUDs while effective at preventing pregnancy do not prevent transmission of sexually transmitted diseases and use of barrier methods for this purpose was discussed.  Low overall incidence of failure with 99.7% efficacy rate in typical use.  The patient has not contraindication to IUD placement. ? ?3.  She will return for a annual exam in 1 year.  All questions answered. ? ?4) A total of 15 minutes were spent in face-to-face contact with the patient during this encounter with over half of that time devoted to counseling and coordination of care. ? ?5) No follow-ups on file. ? ?31, CNM  ?Carie Caddy, [facility]  ?03/28/22  ?3:38 PM  ? ?  ?
